# Patient Record
Sex: Male | Born: 1950 | Race: Black or African American | Hispanic: No | Smoking: Current every day smoker
Health system: Southern US, Community
[De-identification: ages and names within clinical notes are randomized; demographics above are authoritative.]

## PROBLEM LIST (undated history)

## (undated) DIAGNOSIS — C799 Secondary malignant neoplasm of unspecified site: Secondary | ICD-10-CM

## (undated) DIAGNOSIS — T148XXA Other injury of unspecified body region, initial encounter: Secondary | ICD-10-CM

## (undated) DIAGNOSIS — I10 Essential (primary) hypertension: Secondary | ICD-10-CM

## (undated) DIAGNOSIS — N400 Enlarged prostate without lower urinary tract symptoms: Secondary | ICD-10-CM

## (undated) HISTORY — PX: HAND SURGERY: SHX662

## (undated) HISTORY — DX: Other injury of unspecified body region, initial encounter: T14.8XXA

## (undated) HISTORY — DX: Secondary malignant neoplasm of unspecified site: C79.9

## (undated) HISTORY — DX: Essential (primary) hypertension: I10

## (undated) HISTORY — DX: Benign prostatic hyperplasia without lower urinary tract symptoms: N40.0

## (undated) HISTORY — PX: APPENDECTOMY: SHX54

---

## 1973-11-13 HISTORY — PX: EYE SURGERY: SHX253

## 2005-08-08 ENCOUNTER — Inpatient Hospital Stay (HOSPITAL_COMMUNITY): Admission: EM | Admit: 2005-08-08 | Discharge: 2005-08-10 | Payer: Self-pay | Admitting: Emergency Medicine

## 2005-08-08 ENCOUNTER — Encounter: Payer: Self-pay | Admitting: Emergency Medicine

## 2006-03-29 ENCOUNTER — Ambulatory Visit (HOSPITAL_COMMUNITY): Admission: RE | Admit: 2006-03-29 | Discharge: 2006-03-29 | Payer: Self-pay | Admitting: Family Medicine

## 2010-05-31 ENCOUNTER — Ambulatory Visit: Payer: Self-pay | Admitting: Gastroenterology

## 2010-05-31 DIAGNOSIS — R634 Abnormal weight loss: Secondary | ICD-10-CM

## 2010-05-31 DIAGNOSIS — K625 Hemorrhage of anus and rectum: Secondary | ICD-10-CM

## 2010-05-31 DIAGNOSIS — R630 Anorexia: Secondary | ICD-10-CM

## 2010-06-07 ENCOUNTER — Encounter: Payer: Self-pay | Admitting: Gastroenterology

## 2010-06-10 ENCOUNTER — Ambulatory Visit (HOSPITAL_COMMUNITY): Admission: RE | Admit: 2010-06-10 | Discharge: 2010-06-10 | Payer: Self-pay | Admitting: Gastroenterology

## 2010-06-10 ENCOUNTER — Ambulatory Visit: Payer: Self-pay | Admitting: Gastroenterology

## 2010-06-17 ENCOUNTER — Encounter (INDEPENDENT_AMBULATORY_CARE_PROVIDER_SITE_OTHER): Payer: Self-pay

## 2010-09-15 ENCOUNTER — Encounter (INDEPENDENT_AMBULATORY_CARE_PROVIDER_SITE_OTHER): Payer: Self-pay | Admitting: *Deleted

## 2010-12-13 NOTE — Letter (Signed)
Summary: Internal Other /TCS/? EGD order  Internal Other /TCS/? EGD order   Imported By: Cloria Spring LPN 11/91/4782 95:62:13  _____________________________________________________________________  External Attachment:    Type:   Image     Comment:   External Document

## 2010-12-13 NOTE — Letter (Signed)
Summary: Plan of Care, Need to Discuss  [facility]  [address]   [place], [place] [address]   Phone: [phone]  Fax: [phone]    June 17, 2010  Gregory Hickman [address] [place], [place]  [address] 05/22/1951   Dear Gregory Hickman,   We are writing this letter to inform you of treatment plans and/or discuss your plan of care.  We have tried several times to contact you; however, we have yet to reach you.  We ask that you please contact our office for follow-up on your gastrointestinal issues.  We can  be reached at [phone] to schedule an appointment, or to speak with someone regarding your health care needs.  Please do not neglect your health.   Sincerely,    Hendricks Limes LPN  [facility] Ph: [phone]    Fax: [phone]

## 2010-12-13 NOTE — Letter (Signed)
Summary: Recall Office Visit  [facility]  [address]   [place], [place] [address]   Phone: [phone]  Fax: [phone]      September 15, 2010   PACEY ALTIZER [address] [place], [place]  [address] [birth year]   Dear Mr. BAUMLER,   According to our records, it is time for you to schedule a follow-up office visit with [place].   At your convenience, please call [phone] to schedule an office visit. If you have any questions, concerns, or feel that this letter is in error, we would appreciate your call.   Sincerely,    Diana Eves  [facility] Ph: [phone]   Fax: [phone]

## 2010-12-13 NOTE — Letter (Signed)
Summary: release of information to [facility]  release of information to [facility]   Imported By: Rosine Beat 06/07/2010 13:53:27  _____________________________________________________________________  External Attachment:    Type:   Image     Comment:   External Document

## 2010-12-13 NOTE — Assessment & Plan Note (Signed)
Summary: RECTAL BLEEDING, ANOREXIA, wt loss   Visit Type:  Initial Consult Referring Provider:  Claggett Primary Care Provider:  Claggett, M. D.  Chief Complaint:  blood in stool/ wt loss.  History of Present Illness: Has hemorrhoids but having rectal bleeding x1 associated with swollen hemorrhoids. Unintentional 7 lb weight loss. Ran out of BP pills and no appetite. But got them today. Feels sick at stomach and vomits 1-2x/mo. Watery stools: most of the time, BM: 2x/day. has a dry cough. Cigs: 1pk q2days. No pain in abd pain in belly or problems swallowing.  Current Medications (verified): 1)  Lisinopril-Hydrochlorothiazide 20-12.5 Mg Tabs (Lisinopril-Hydrochlorothiazide) .... Take 1 Tablet By Mouth Two Times A Day  Allergies (verified): No Known Drug Allergies  Past History:  Past Medical History: Hypertension Back pain  Past Surgical History: BIL hands Eyes Appendectomy  Family History: No FH of Colon Cancer or polyps  Social History: Divorced, no kids Used to work on the farm and [employer] Alcohol Use - yes: 2 cans a day  Review of Systems       Per HPI otherwise allsystems negative.   Vital Signs:  Patient profile:   60 year old male Height:      63 inches Weight:      124.50 pounds BMI:     22.13 Temp:     98.1 degrees F oral Pulse rate:   80 / minute BP sitting:   120 / 80  (left arm) Cuff size:   regular  Vitals Entered By: Cloria Spring LPN (May 31, 2010 3:41 PM)  Physical Exam  General:  Well developed, well nourished, no acute distress. Head:  Normocephalic and atraumatic. Eyes:  PERRLA. Mouth:  No deformity or lesions. Neck:  Supple; no masses. Lungs:  Clear throughout to auscultation. Heart:  Regular rate and rhythm; no murmurs. Abdomen:  Soft, nontender and nondistended. No masses, or hernias noted. Normal bowel sounds. Extremities:  No edema noted. Neurologic:  Alert and  oriented x4;  grossly normal neurologically.  Impression &  Recommendations:  Problem # 1:  RECTAL BLEEDING (ICD-569.3) Assessment New Lkely 2o to hemorrhoids. Differential includes colorectal polyp or CA. TCS/?EGD on 7/29-TRILYTE. Explained procedure. Obtain recent labs from [facility].  Problem # 2:  ANOREXIA (ICD-783.0) Assessment: New May be due to colon CA or H. pylori gastritis, doubt gastric CA. EGD  7/29  Problem # 3:  WEIGHT LOSS, RECENT (VWU-981.19) Assessment: New unexplained. TCS/?EGD next week.  CC: PCP  Appended Document: RECTAL BLEEDING, ANOREXIA, wt loss weight 131 lbs earlier this year.  Appended Document: Orders Update    Clinical Lists Changes  Orders: Added new Service order of Consultation Level III [phone]) - Signed      Appended Document: RECTAL BLEEDING, ANOREXIA, wt loss July 2011 wbc 7.9 HB 15 PLT 197 CR 0.98 ALB 4.9 ALK PHOS 72  AST 107 ALT 68 TRIG 40 CHOL 171  Appended Document: RECTAL BLEEDING, ANOREXIA, wt loss Elevated liver enzymes 2o to EtOH Hepatitis. Needs HCV Ab. OPV in 3-4 mos.  Appended Document: RECTAL BLEEDING, ANOREXIA, wt loss OCT 2010  128 LBS BMI 22.67 AST 36 ALT 29  CHOL 159  TRIG 37 CR 0.99  Appended Document: RECTAL BLEEDING, ANOREXIA, wt loss reminder in computer

## 2011-03-31 NOTE — Op Note (Signed)
NAMEOSHA, ERRICO            ACCOUNT NO.:  [id number]   MEDICAL RECORD NO.:  [id number]          PATIENT TYPE:  INP   LOCATION:  5017                         FACILITY:  [facility]   PHYSICIAN:  Dionne Ano. Gramig III, M.D.DATE OF BIRTH:  [birth year]   DATE OF PROCEDURE:  08/08/2005  DATE OF DISCHARGE:                                 OPERATIVE REPORT   PREOPERATIVE DIAGNOSIS:  Crush injury, right hand with open fourth and fifth  metacarpal fractures and large flap avulsion volarly about the hand.   POSTOPERATIVE DIAGNOSIS:  Crush injury, right hand with open fourth and  fifth metacarpal fractures and large flap avulsion volarly about the hand.   OPERATION PERFORMED:  1.  Incision and drainage of open right hand fracture.  This was an      excisional debridement of open fourth and fifth metacarpal fractures.  2.  Exploration, right hand with neurolysis of the ulnar digital nerve to      the small finger and complex volar flap closure.  3.  Open reduction internal fixation, right fourth metacarpal fracture.  4.  Open reduction internal fixation, right fifth metacarpal fracture.  5.  Stress radiography.   SURGEON:  Dionne Ano. Amanda Pea, M.D.   ASSISTANT:  Karie Chimera, P.A.-C.   ANESTHESIA:  General.   COMPLICATIONS:  None.   DRAINS:  Multiple.   INDICATIONS FOR PROCEDURE:  This patient is an unfortunate 60 year old male  who was injured today at approximately 1:30.  He was seen at [facility], transferred to [facility] system for evaluation and  treatment.  I was asked to see him and take over his care.  I discussed with  him his upper extremity predicament after the crush injury.  He has open  fourth and fifth metacarpal fractures and large degloving injury to the  volar pad of his hand.  The patient and I have discussed risks of surgery  including infection, digital loss, poor function, need for amputation, etc.  With this in mind, he desires to proceed.   DESCRIPTION OF PROCEDURE:  The patient was seen by myself and anesthesia and  taken to the operative suite, underwent smooth induction of general  anesthesia.  He was laid supine, appropriately padded, prepped and draped in  the usual sterile fashion about the right upper extremity after body parts  were secured and he was well padded.  I should note that he was given  tetanus and Ancef in the emergency room.  He will be placed on gentamicin  and Ancef postoperatively.  Once the patient had sterile field secured, I  performed incision and drainage of skin and subcutaneous tissue I removed a  1 mm rim of tissue and any nonviable tissue including grass and dirty debris  in the volar flap laceration.  The patient had exposure of the small finger  ulnar digital nerve.  This underwent a neurolysis without difficulty.  Following this, I then carefully and meticulously removed all foreign  contaminated portions of the wound and in addition to this, performed  removal of devitalized hypothenar muscle.  The patient did have intact  neurovascular bundles.  The flexor tendon apparatus was fortunately intact  as well.  Once this was done, the patient then underwent greater than 6 L of  irrigation, placed in the hand.  This was irrigated copiously without  difficulty.  Thus incision and drainage of open fractures, exploration and  neurolysis of the ulnar digital nerve was accomplished.  This was a very  large volar laceration secondary to the crushing injury.  Once this done, I  then made an incision under tourniquet control about the base of the fourth  and fifth metacarpals dorsally.  I dissected down, made pilot holes about  the base of the fourth and fifth metacarpals and threaded a 0.062 K-wire  down the shaft, intramedullary of the fourth metacarpal and engaged it  distally about the metacarpal head.  A 0.045 and two 0.035 K-wires were  stacked in the small finger.  These achieved reduction of the  fracture  fragments which were very unstable.  The patient had the open reduction  internal fixation of the fourth and fifth metacarpal fractures performed in  this fashion without difficulty.  Stress radiography revealed excellent  position at the conclusion of the case and I was pleased with this.  Following this, I then performed a small incision between the third and  fourth metacarpals, decompressed the large hematoma and placed a drain here.  Once this was done, tourniquet was deflated.  I performed copious irrigation  to the area followed by complex closure of the flap laceration with multiple  blue vessel loop drains used.  Combination of 4-0 chromic and 4-0 Prolene  was used for the complex closure and I did leave portions open to allow for  the egress of fluid and prevention of hematoma formation.  The patient had  excellent refill, good splay of the fingers, x-rays looked excellent and I  was pleased with the same, placement of sterile dressing.  Compartments were  soft and were basically decompressed from the injury itself.  Following  this, he was placed in a volar plaster splint and extubated and taken to  recovery room.  All sponge and needle counts were reported as correct.  During the course of his operation, he had greater than 8 L of fluid placed  through his wounds and this will hopefully prevent infection.  This is a  very severe injury and one that we will need to watch closely.           ______________________________  Dionne Ano. Everlene Other, M.D.     Nash Mantis  D:  08/08/2005  T:  08/09/2005  Job:  [id number]

## 2013-11-13 HISTORY — PX: HERNIA REPAIR: SHX51

## 2017-12-28 ENCOUNTER — Encounter: Payer: Self-pay | Admitting: Gastroenterology

## 2018-03-07 ENCOUNTER — Ambulatory Visit: Payer: Self-pay | Admitting: Nurse Practitioner

## 2018-03-13 ENCOUNTER — Encounter: Payer: Self-pay | Admitting: Nurse Practitioner

## 2018-03-25 ENCOUNTER — Ambulatory Visit: Payer: Self-pay | Admitting: Nurse Practitioner

## 2018-04-09 ENCOUNTER — Encounter: Payer: Self-pay | Admitting: Nurse Practitioner

## 2018-04-09 ENCOUNTER — Ambulatory Visit (INDEPENDENT_AMBULATORY_CARE_PROVIDER_SITE_OTHER): Payer: Medicare HMO | Admitting: Nurse Practitioner

## 2018-04-09 DIAGNOSIS — R195 Other fecal abnormalities: Secondary | ICD-10-CM | POA: Insufficient documentation

## 2018-04-09 DIAGNOSIS — D649 Anemia, unspecified: Secondary | ICD-10-CM

## 2018-04-09 NOTE — Progress Notes (Signed)
Primary Care Physician:  Joyice Faster, FNP Primary Gastroenterologist:  Dr. Oneida Alar  Chief Complaint  Patient presents with  . Consult    TCS. Last done in about 2011    HPI:   Gregory Hickman is a 67 y.o. male who presents on referral from primary care for colonoscopy, dark stools, EtOH.  Reviewed information provided with the referral including his visit dated 2020-01-1418.  At that time primary care noted complaints of bowel movements looking black for 1 year.  Has a history of GERD, on omeprazole 20 mg daily.  Drinks 3-4 beers a day, depending on if he has the money for it.  No interest in quitting drinking at this time.  Occasionally uses marijuana "and cocaine if he can afford it."  Per patient description, sometimes stools are normal and sometimes dark.  Denies hematochezia.  Noted history of anemia and primary care ordered labs to follow-up at that time.  No labs were included with the referral.  Last TCS 06/10/2010 which found tortuous colon, otherwise no polyps masses inflammatory changes or AVMs seen.  Normal terminal ileum.  Rare descending colon and sigmoid diverticula.  Moderate internal hemorrhoids.  Pathology of random colon biopsy found benign colonic mucosa.  Recommended repeat screening colonoscopy in 10 years (2021).  EGD completed the same day found normal esophagus without evidence of Barrett's, mass, erosion, ulceration, or stricture.  Diffuse erythema and edema of the gastric mucosa with frequent erosions status post biopsies.  Diffuse erythema and patchy edema of the duodenal bulb and second portion of the duodenum.  Normal ampulla.  Consistent with moderate gastritis and duodenitis likely causing nausea, vomiting, possible anorexia.  Stomach biopsies on chronic, active gastritis with H. pylori.  Recommended avoid NSAIDs, aspirin.  High-fiber diet.  Omeprazole 20 mg daily.  I am unable to determine if he was ever treated for H. pylori.  No recent labs in our system  since 2011.  Today he states he's doing ok overall. Denies abdominal pain, N/V, hematochezia. Has intermittent black stools which he thinks is related to medication (thinks it is his iron). Denies recent bloodwork with PCP. Denies fever, chills, unintentional weight loss, acute changes in his bowel movement. Denies chest pain, dyspnea, dizziness, lightheadedness, syncope, near syncope. Denies any other upper or lower GI symptoms.  Past Medical History:  Diagnosis Date  . BPH (benign prostatic hyperplasia)   . Fracture   . Hypertension     Past Surgical History:  Procedure Laterality Date  . APPENDECTOMY    . EYE SURGERY  1975  . HAND SURGERY    . HERNIA REPAIR  2015    Current Outpatient Medications  Medication Sig Dispense Refill  . amLODipine (NORVASC) 10 MG tablet Take 1 tablet by mouth daily.    Marland Kitchen atorvastatin (LIPITOR) 40 MG tablet Take 1 tablet by mouth daily.    . clopidogrel (PLAVIX) 75 MG tablet Take 1 tablet by mouth daily.    Marland Kitchen dutasteride (AVODART) 0.5 MG capsule Take 1 capsule by mouth daily.    . ferrous sulfate 325 (65 FE) MG tablet Take 325 mg by mouth 2 (two) times daily with a meal.    . lisinopril-hydrochlorothiazide (PRINZIDE,ZESTORETIC) 20-12.5 MG tablet Take 1 tablet by mouth daily.    . nabumetone (RELAFEN) 750 MG tablet Take 1 tablet by mouth 2 (two) times daily.    Marland Kitchen omeprazole (PRILOSEC) 20 MG capsule Take 1 capsule by mouth daily.    . tamsulosin (FLOMAX) 0.4 MG  CAPS capsule Take 1 tablet by mouth daily.     No current facility-administered medications for this visit.     Allergies as of 04/09/2018  . (No Known Allergies)    Family History  Problem Relation Age of Onset  . Colon cancer Neg Hx     Social History   Socioeconomic History  . Marital status: Divorced    Spouse name: Not on file  . Number of children: Not on file  . Years of education: Not on file  . Highest education level: Not on file  Occupational History  . Not on file    Social Needs  . Financial resource strain: Not on file  . Food insecurity:    Worry: Not on file    Inability: Not on file  . Transportation needs:    Medical: Not on file    Non-medical: Not on file  Tobacco Use  . Smoking status: Current Every Day Smoker    Packs/day: 0.50    Types: Cigarettes  . Smokeless tobacco: Never Used  Substance and Sexual Activity  . Alcohol use: Yes    Comment: As of 04/09/18: 3-4 beers daily  . Drug use: Yes    Types: Marijuana, "Crack" cocaine    Comment: 1-2 times per month; marijuana, cocaine when he can afford it  . Sexual activity: Not on file  Lifestyle  . Physical activity:    Days per week: Not on file    Minutes per session: Not on file  . Stress: Not on file  Relationships  . Social connections:    Talks on phone: Not on file    Gets together: Not on file    Attends religious service: Not on file    Active member of club or organization: Not on file    Attends meetings of clubs or organizations: Not on file    Relationship status: Not on file  . Intimate partner violence:    Fear of current or ex partner: Not on file    Emotionally abused: Not on file    Physically abused: Not on file    Forced sexual activity: Not on file  Other Topics Concern  . Not on file  Social History Narrative  . Not on file    Review of Systems: Complete ROS negative except as per HPI.    Physical Exam: BP 106/60   Pulse 86   Temp (!) 97.2 F (36.2 C) (Oral)   Ht 5\' 3"  (1.6 m)   Wt 115 lb 9.6 oz (52.4 kg)   BMI 20.48 kg/m  General:   Alert and oriented. Pleasant and cooperative. Well-nourished and well-developed.  Head:  Normocephalic and atraumatic. Eyes:  Without icterus, sclera clear and conjunctiva pink.  Ears:  Normal auditory acuity. Cardiovascular:  S1, S2 present without murmurs appreciated. Extremities without clubbing or edema. Respiratory:  Clear to auscultation bilaterally. No wheezes, rales, or rhonchi. No distress.   Gastrointestinal:  +BS, soft, non-tender and non-distended. No HSM noted. No guarding or rebound. No masses appreciated.  Rectal:  Deferred  Musculoskalatal:  Symmetrical without gross deformities. Skin:  Intact without significant lesions or rashes. Neurologic:  Alert and oriented x4;  grossly normal neurologically. Psych:  Alert and cooperative. Normal mood and affect. Heme/Lymph/Immune: No excessive bruising noted.    04/09/2018 12:20 PM   Disclaimer: This note was dictated with voice recognition software. Similar sounding words can inadvertently be transcribed and may not be corrected upon review.

## 2018-04-09 NOTE — Progress Notes (Signed)
cc'd to pcp 

## 2018-04-09 NOTE — Patient Instructions (Signed)
1. Have your lab tests completed when you are able to. 2. Stop taking iron for 1 week. 3. Next Wednesday or Thursday, use the stool test for blood at home. 4. You can bring it back to our office when you have completed it. 5. We will call you with the results. 6. Follow-up in 2 months. 7. Depending on the results of your tests above, we will may need to plan for colonoscopy and/or an upper endoscopy. 8. Call if you have any questions or concerns.  At [facility] we value your feedback. You may receive a survey about your visit today. Please share your experience as we strive to create trusting relationships with our patients to provide genuine, compassionate, quality care.  It was great to see you today!  I hope you have a great summer!!

## 2018-04-09 NOTE — Assessment & Plan Note (Signed)
The patient notes intermittent dark stools when he is taking iron.  When he is not taking iron his stools are normal.  At this point I will have him stop iron for 1 week and check an iFOBT.  Further labs as per below.  Pending results, may need colonoscopy and/or upper endoscopy.  Follow-up in 2 months.

## 2018-04-09 NOTE — Assessment & Plan Note (Signed)
Per primary care notes, patient has a history of anemia.  No recent labs.  At this point I will check a CBC, iron, ferritin.  I will have him stop taking his iron supplementation for 1 week and then perform an iFOBT test to see if there is any blood in his stools.  We will call him with results.  Pending his results we may need to schedule colonoscopy and/or EGD.  Follow-up in 2 months.

## 2018-04-10 LAB — CBC WITH DIFFERENTIAL/PLATELET
BASOS ABS: 73 {cells}/uL (ref 0–200)
Basophils Relative: 0.6 %
EOS PCT: 0.6 %
Eosinophils Absolute: 73 cells/uL (ref 15–500)
HCT: 42.3 % (ref 38.5–50.0)
HEMOGLOBIN: 14.8 g/dL (ref 13.2–17.1)
Lymphs Abs: 3582 cells/uL (ref 850–3900)
MCH: 31.6 pg (ref 27.0–33.0)
MCHC: 35 g/dL (ref 32.0–36.0)
MCV: 90.2 fL (ref 80.0–100.0)
MONOS PCT: 11 %
MPV: 10.5 fL (ref 7.5–12.5)
NEUTROS ABS: 7042 {cells}/uL (ref 1500–7800)
Neutrophils Relative %: 58.2 %
PLATELETS: 364 10*3/uL (ref 140–400)
RBC: 4.69 10*6/uL (ref 4.20–5.80)
RDW: 13.2 % (ref 11.0–15.0)
Total Lymphocyte: 29.6 %
WBC mixed population: 1331 cells/uL — ABNORMAL HIGH (ref 200–950)
WBC: 12.1 10*3/uL — ABNORMAL HIGH (ref 3.8–10.8)

## 2018-04-10 LAB — IRON: IRON: 72 ug/dL (ref 50–180)

## 2018-04-10 LAB — FERRITIN: FERRITIN: 63 ng/mL (ref 20–380)

## 2018-04-16 NOTE — Progress Notes (Signed)
Pt is aware of results and that he needs to follow up with PCP on his elevated white blood count. Forwarding to Manuela Schwartz to forward to PCP.

## 2018-04-16 NOTE — Progress Notes (Signed)
To Manuela Schwartz.

## 2018-04-30 ENCOUNTER — Telehealth: Payer: Self-pay

## 2018-04-30 NOTE — Telephone Encounter (Signed)
Received a fax from [facility] asking if pt has done iFOBT yet. I called pt and he said he has not had transportation. He will try to do in the next few days and will mail it to [place]. I faxed a note to [facility] and told them and that I would let them know when it is done.

## 2018-06-27 ENCOUNTER — Other Ambulatory Visit (HOSPITAL_COMMUNITY): Payer: Self-pay | Admitting: Family

## 2018-06-27 DIAGNOSIS — R9389 Abnormal findings on diagnostic imaging of other specified body structures: Secondary | ICD-10-CM

## 2018-06-27 DIAGNOSIS — R079 Chest pain, unspecified: Secondary | ICD-10-CM

## 2018-07-09 ENCOUNTER — Encounter: Payer: Self-pay | Admitting: Nurse Practitioner

## 2018-07-09 ENCOUNTER — Ambulatory Visit (HOSPITAL_COMMUNITY): Payer: Medicare HMO

## 2018-07-09 ENCOUNTER — Ambulatory Visit (INDEPENDENT_AMBULATORY_CARE_PROVIDER_SITE_OTHER): Payer: Medicare HMO | Admitting: Nurse Practitioner

## 2018-07-09 VITALS — BP 130/76 | HR 62 | Temp 96.6°F | Ht 63.0 in | Wt 108.0 lb

## 2018-07-09 DIAGNOSIS — R634 Abnormal weight loss: Secondary | ICD-10-CM

## 2018-07-09 DIAGNOSIS — D649 Anemia, unspecified: Secondary | ICD-10-CM

## 2018-07-09 DIAGNOSIS — R195 Other fecal abnormalities: Secondary | ICD-10-CM | POA: Diagnosis not present

## 2018-07-09 NOTE — Assessment & Plan Note (Signed)
History of anemia, last CBC with normal hemoglobin, iron, ferritin.  I will recheck his CBC today as it has not been checked in about 3 months.  We will call him with results.  Follow-up in 3 months.

## 2018-07-09 NOTE — Assessment & Plan Note (Signed)
The patient has had an objective weight loss of about 7 or 8 pounds in the past 3 months.  Looking at his chart it appears he has intermittent weight loss as far back as 2011 that I can find.  His colonoscopy and endoscopy are up-to-date.  He has generally unhealthy lifestyle including 1/4 pack of cigarettes a day, 3-4 beers a day, marijuana 1-2 times a week, cocaine intermittently as he can afford.  This likely has a plan with his weight loss.  We will see him back in 3 months to recheck his weight and for any further symptoms that may justify early interval endoscopic evaluation.

## 2018-07-09 NOTE — Assessment & Plan Note (Signed)
He states when he stopped taking iron her stools returned to normal.  However, he declined FOBT.  He is taking iron again and his stools are black again.  Given his recent lab work in May I doubt he is having any GI bleeding.  We will continue to follow.  Follow-up in 3 months.

## 2018-07-09 NOTE — Addendum Note (Signed)
Addended by: Gordy Levan, Aralyn Nowak A on: 07/09/2018 08:47 AM   Modules accepted: Orders

## 2018-07-09 NOTE — Progress Notes (Signed)
CC'D TO PCP °

## 2018-07-09 NOTE — Patient Instructions (Signed)
1. Have your blood test done when you are able to. 2. Continue your current medications. 3. Return for follow-up in 3 months. 4. Call us if you have any questions or concerns.  At [facility] we value your feedback. You may receive a survey about your visit today. Please share your experience as we strive to create trusting relationships with our patients to provide genuine, compassionate, quality care.  We appreciate your understanding and patience as we review any laboratory studies, imaging, and other diagnostic tests that are ordered as we care for you. Our office policy is 5 business days for review of these results, and any emergent or urgent results are addressed in a timely manner for your best interest. If you do not hear from our office in 1 week, please contact us.   We also encourage the use of MyChart, which contains your medical information for your review as well. If you are not enrolled in this feature, an access code is available on this after visit summary for your convenience. Thank you for allowing [place] to be involved in the care of you and your family.   It was great to see you today!  I hope you have a great summer!!

## 2018-07-09 NOTE — Progress Notes (Signed)
Referring Provider: Joyice Faster, FNP Primary Care Physician:  Joyice Faster, FNP Primary GI:  Dr. Oneida Alar  Chief Complaint  Patient presents with  . Anemia    dark stools    HPI:   Gregory Hickman is a 67 y.o. male who presents to follow-up on anemia.  The patient was last seen in our office 04/09/2018 for the same as well as dark stools.  Noted history of GERD and daily alcohol consumption of 3-4 beers a day depending on finances.  No interest in quitting drinking.  Occasional use of marijuana and "cocaine if I can afford it."  Stools intermittently dark.  Last colonoscopy 2011 which was essentially normal other than tortuous colon and recommended repeat in 2021.  EGD the same day with diffuse erythema and edema of the gastric mucosa and frequent erosions status post biopsy, diffuse erythema and patchy edema of the duodenal bulb and second portion of the duodenum.  Essentially consistent with moderate gastritis and duodenitis with biopsy showing H. pylori.  Recommended avoid NSAIDs.  Unknown treatment status of H. Pylori.  At his last visit he noted intermittent black stools that he attributed to medication and described recent blood work with primary care.  No GI symptoms.  Recommended he bring a copy of primary care blood work, stop iron for 1 week, he Hemoccult test at home, follow-up in 2 months.  Query need for early interval colonoscopy or endoscopy depending on results.  Also recommended CBC, iron, ferritin.  Completed 04/01/2018 which showed normal hemoglobin, mild leukocytosis which was forwarded to primary care.  Normal platelets.  Iron and ferritin were both normal as well.  It does not appear he completed iFOBT  Today he states he's doing well. Still with dark stools but on iron. Denies abdominal pain, N/V, hematochezia, fever, chills. Describes unintentional weight loss of 20 pounds over 3 months. Objectively has lost 8 lbs in 3 months. States he never completed the  iFOBT. His big concern is his back pain, has a CT ordered today. Denies chest pain, dyspnea, dizziness, lightheadedness, syncope, near syncope. Denies any other upper or lower GI symptoms.  Declines iFOBT.  Past Medical History:  Diagnosis Date  . BPH (benign prostatic hyperplasia)   . Fracture   . Hypertension     Past Surgical History:  Procedure Laterality Date  . APPENDECTOMY    . EYE SURGERY  1975  . HAND SURGERY    . HERNIA REPAIR  2015    Current Outpatient Medications  Medication Sig Dispense Refill  . amLODipine (NORVASC) 10 MG tablet Take 1 tablet by mouth daily.    Marland Kitchen atorvastatin (LIPITOR) 40 MG tablet Take 1 tablet by mouth daily.    . clopidogrel (PLAVIX) 75 MG tablet Take 1 tablet by mouth daily.    Marland Kitchen dutasteride (AVODART) 0.5 MG capsule Take 1 capsule by mouth daily.    . ferrous sulfate 325 (65 FE) MG tablet Take 325 mg by mouth 2 (two) times daily with a meal.    . lisinopril-hydrochlorothiazide (PRINZIDE,ZESTORETIC) 20-12.5 MG tablet Take 1 tablet by mouth daily.    . nabumetone (RELAFEN) 750 MG tablet Take 1 tablet by mouth 2 (two) times daily.    Marland Kitchen omeprazole (PRILOSEC) 20 MG capsule Take 1 capsule by mouth daily.    . tamsulosin (FLOMAX) 0.4 MG CAPS capsule Take 1 tablet by mouth daily.     No current facility-administered medications for this visit.     Allergies as  of 07/09/2018  . (No Known Allergies)    Family History  Problem Relation Age of Onset  . Colon cancer Neg Hx     Social History   Socioeconomic History  . Marital status: Divorced    Spouse name: Not on file  . Number of children: Not on file  . Years of education: Not on file  . Highest education level: Not on file  Occupational History  . Not on file  Social Needs  . Financial resource strain: Not on file  . Food insecurity:    Worry: Not on file    Inability: Not on file  . Transportation needs:    Medical: Not on file    Non-medical: Not on file  Tobacco Use  .  Smoking status: Current Every Day Smoker    Packs/day: 0.25    Types: Cigarettes  . Smokeless tobacco: Never Used  . Tobacco comment: one pack of cigs last 3 days  Substance and Sexual Activity  . Alcohol use: Yes    Comment: As of 07/09/18: 3-4 beers daily  . Drug use: Yes    Types: Marijuana, "Crack" cocaine    Comment: 1-2 times per month; marijuana, cocaine when he can afford it  . Sexual activity: Not on file  Lifestyle  . Physical activity:    Days per week: Not on file    Minutes per session: Not on file  . Stress: Not on file  Relationships  . Social connections:    Talks on phone: Not on file    Gets together: Not on file    Attends religious service: Not on file    Active member of club or organization: Not on file    Attends meetings of clubs or organizations: Not on file    Relationship status: Not on file  Other Topics Concern  . Not on file  Social History Narrative  . Not on file    Review of Systems: Complete ROS negative except as per HPI.   Physical Exam: BP 130/76   Pulse 62   Temp (!) 96.6 F (35.9 C) (Oral)   Ht 5\' 3"  (1.6 m)   Wt 108 lb (49 kg)   BMI 19.13 kg/m  General:   Alert and oriented. Pleasant and cooperative. Well-nourished and well-developed.  Eyes:  Without icterus, sclera clear and conjunctiva pink.  Ears:  Normal auditory acuity. Cardiovascular:  S1, S2 present without murmurs appreciated. Extremities without clubbing or edema. Respiratory:  Clear to auscultation bilaterally. No wheezes, rales, or rhonchi. No distress.  Gastrointestinal:  +BS, soft, non-tender and non-distended. No HSM noted. No guarding or rebound. No masses appreciated.  Rectal:  Deferred  Musculoskalatal:  Symmetrical without gross deformities. Skin:  Intact without significant lesions or rashes. Neurologic:  Alert and oriented x4;  grossly normal neurologically. Psych:  Alert and cooperative. Normal mood and affect. Heme/Lymph/Immune: No excessive bruising  noted.    07/09/2018 8:33 AM   Disclaimer: This note was dictated with voice recognition software. Similar sounding words can inadvertently be transcribed and may not be corrected upon review.

## 2018-07-24 ENCOUNTER — Ambulatory Visit (HOSPITAL_COMMUNITY)
Admission: RE | Admit: 2018-07-24 | Discharge: 2018-07-24 | Disposition: A | Discharge status: Home / Self Care | Payer: Medicare HMO | Source: Ambulatory Visit | Attending: Family | Admitting: Family

## 2018-07-24 DIAGNOSIS — J439 Emphysema, unspecified: Secondary | ICD-10-CM | POA: Diagnosis not present

## 2018-07-24 DIAGNOSIS — R079 Chest pain, unspecified: Secondary | ICD-10-CM | POA: Diagnosis present

## 2018-07-24 DIAGNOSIS — R590 Localized enlarged lymph nodes: Secondary | ICD-10-CM | POA: Diagnosis not present

## 2018-07-24 DIAGNOSIS — I700 Atherosclerosis of aorta: Secondary | ICD-10-CM | POA: Diagnosis not present

## 2018-07-24 DIAGNOSIS — R9389 Abnormal findings on diagnostic imaging of other specified body structures: Secondary | ICD-10-CM | POA: Diagnosis present

## 2018-07-24 DIAGNOSIS — R918 Other nonspecific abnormal finding of lung field: Secondary | ICD-10-CM | POA: Insufficient documentation

## 2018-07-24 LAB — POCT I-STAT CREATININE: Creatinine, Ser: 1 mg/dL (ref 0.61–1.24)

## 2018-07-24 MED ORDER — IOPAMIDOL (ISOVUE-300) INJECTION 61%
75.0000 mL | Freq: Once | INTRAVENOUS | Status: AC | PRN
  Administered 2018-07-24: 75 mL via INTRAVENOUS

## 2018-07-30 ENCOUNTER — Other Ambulatory Visit: Payer: Self-pay

## 2018-07-30 ENCOUNTER — Encounter (HOSPITAL_COMMUNITY): Payer: Self-pay | Admitting: Internal Medicine

## 2018-07-30 ENCOUNTER — Inpatient Hospital Stay (HOSPITAL_COMMUNITY): Payer: Medicare HMO | Attending: Internal Medicine | Admitting: Internal Medicine

## 2018-07-30 ENCOUNTER — Inpatient Hospital Stay (HOSPITAL_COMMUNITY): Payer: Medicare HMO

## 2018-07-30 VITALS — BP 128/59 | HR 68 | Temp 98.4°F | Resp 16 | Ht 63.0 in | Wt 107.7 lb

## 2018-07-30 DIAGNOSIS — R634 Abnormal weight loss: Secondary | ICD-10-CM | POA: Insufficient documentation

## 2018-07-30 DIAGNOSIS — R9389 Abnormal findings on diagnostic imaging of other specified body structures: Secondary | ICD-10-CM

## 2018-07-30 DIAGNOSIS — I10 Essential (primary) hypertension: Secondary | ICD-10-CM | POA: Diagnosis not present

## 2018-07-30 DIAGNOSIS — G4489 Other headache syndrome: Secondary | ICD-10-CM | POA: Diagnosis not present

## 2018-07-30 DIAGNOSIS — F1290 Cannabis use, unspecified, uncomplicated: Secondary | ICD-10-CM | POA: Insufficient documentation

## 2018-07-30 DIAGNOSIS — M545 Low back pain: Secondary | ICD-10-CM | POA: Diagnosis not present

## 2018-07-30 DIAGNOSIS — Z720 Tobacco use: Secondary | ICD-10-CM | POA: Insufficient documentation

## 2018-07-30 DIAGNOSIS — R42 Dizziness and giddiness: Secondary | ICD-10-CM | POA: Insufficient documentation

## 2018-07-30 DIAGNOSIS — Z7289 Other problems related to lifestyle: Secondary | ICD-10-CM | POA: Insufficient documentation

## 2018-07-30 DIAGNOSIS — R911 Solitary pulmonary nodule: Secondary | ICD-10-CM | POA: Diagnosis not present

## 2018-07-30 LAB — CBC WITH DIFFERENTIAL/PLATELET
BASOS PCT: 0 %
Basophils Absolute: 0 10*3/uL (ref 0.0–0.1)
EOS ABS: 0 10*3/uL (ref 0.0–0.7)
EOS PCT: 0 %
HCT: 38.3 % — ABNORMAL LOW (ref 39.0–52.0)
HEMOGLOBIN: 13.1 g/dL (ref 13.0–17.0)
LYMPHS PCT: 23 %
Lymphs Abs: 2.6 10*3/uL (ref 0.7–4.0)
MCH: 31.8 pg (ref 26.0–34.0)
MCHC: 34.2 g/dL (ref 30.0–36.0)
MCV: 93 fL (ref 78.0–100.0)
MONO ABS: 1.3 10*3/uL — AB (ref 0.1–1.0)
MONOS PCT: 12 %
NEUTROS ABS: 7.3 10*3/uL (ref 1.7–7.7)
Neutrophils Relative %: 65 %
Platelets: 333 10*3/uL (ref 150–400)
RBC: 4.12 MIL/uL — ABNORMAL LOW (ref 4.22–5.81)
RDW: 13.8 % (ref 11.5–15.5)
WBC: 11.3 10*3/uL — ABNORMAL HIGH (ref 4.0–10.5)

## 2018-07-30 LAB — COMPREHENSIVE METABOLIC PANEL
ALBUMIN: 3.6 g/dL (ref 3.5–5.0)
ALT: 10 U/L (ref 0–44)
ANION GAP: 11 (ref 5–15)
AST: 17 U/L (ref 15–41)
Alkaline Phosphatase: 101 U/L (ref 38–126)
BUN: 10 mg/dL (ref 8–23)
CALCIUM: 9.5 mg/dL (ref 8.9–10.3)
CO2: 26 mmol/L (ref 22–32)
Chloride: 94 mmol/L — ABNORMAL LOW (ref 98–111)
Creatinine, Ser: 0.73 mg/dL (ref 0.61–1.24)
GFR calc non Af Amer: >60 mL/min (ref >60)
Glucose, Bld: 78 mg/dL (ref 70–99)
POTASSIUM: 3.2 mmol/L — AB (ref 3.5–5.1)
SODIUM: 131 mmol/L — AB (ref 135–145)
TOTAL PROTEIN: 7.7 g/dL (ref 6.5–8.1)
Total Bilirubin: 0.6 mg/dL (ref 0.3–1.2)

## 2018-07-30 LAB — LACTATE DEHYDROGENASE: LDH: 107 U/L (ref 98–192)

## 2018-07-30 NOTE — Patient Instructions (Signed)
[place] [facility] Discharge Instructions   You were seen today by Dr. Zoila Shutter. She went over your history and your recent scan. She would like for you to have a lung biopsy to determine if you have lung cancer.  She would like for you to have a PET scan and also MRI of your brain.  We will see you back for follow up after you have had the above.    Thank you for choosing [place] at [facility] to provide your oncology and hematology care.  To afford each patient quality time with our provider, please arrive at least 15 minutes before your scheduled appointment time.    If you have a lab appointment with the [facility] please come in thru the  Main Entrance and check in at the main information desk  You need to re-schedule your appointment should you arrive 10 or more minutes late.  We strive to give you quality time with our providers, and arriving late affects you and other patients whose appointments are after yours.  Also, if you no show three or more times for appointments you may be dismissed from the clinic at the providers discretion.     Again, thank you for choosing [facility].  Our hope is that these requests will decrease the amount of time that you wait before being seen by our physicians.       _____________________________________________________________  Should you have questions after your visit to [facility], please contact our office at [phone] between the hours of 8:30 a.m. and 4:30 p.m.  Voicemails left after 4:30 p.m. will not be returned until the following business day.  For prescription refill requests, have your pharmacy contact our office.       Resources For Cancer Patients and their Caregivers ? American Cancer Society: Can assist with transportation, wigs, general needs, runs Look Good Feel Better.        (478)786-8908 ? Cancer Care: Provides financial  assistance, online support groups, medication/co-pay assistance.  1-800-813-HOPE 954-520-8792) ? [place] Assists [place] cancer patients and their families through emotional , educational and financial support.  (601)005-0318 ? [place] DSS Where to apply for food stamps, Medicaid and utility assistance. 786-743-7657 ? [facility]: Transportation to medical appointments. 754-462-3533 ? Social Security Administration: May apply for disability if have a Stage IV cancer. 575-188-4165 (947)072-7963 ? [employer], Disability and Transit Services: Assists with nutrition, care and transit needs. [place] Support Programs:   > Cancer Support Group  2nd Tuesday of the month 1pm-2pm, Journey Room   > Creative Journey  3rd Tuesday of the month 1130am-1pm, Journey Room

## 2018-07-30 NOTE — Progress Notes (Signed)
Referring Physician:  [facility]  Diagnosis Abnormal CAT scan - Plan: CBC with Differential/Platelet, Comprehensive metabolic panel, Lactate dehydrogenase, CT Biopsy, CANCELED: CT Biopsy  Other headache syndrome - Plan: MR Brain W Wo Contrast, CBC with Differential/Platelet, Comprehensive metabolic panel, Lactate dehydrogenase, CANCELED: CT Biopsy  Solitary pulmonary nodule - Plan: NM PET Image Initial (PI) Skull Base To Thigh  Staging Cancer Staging No matching staging information was found for the patient.  Assessment and Plan:  1.  Abnormal scan findings.  Patient was complaining of several month history of back pain.  He had a CT of the chest done 07/24/2018 that showed IMPRESSION: 1. Dominant spiculated left upper lobe mass with multiple enlarged mediastinal, hilar and left axillary lymph nodes, multiple additional pulmonary nodules, a probable left hepatic metastasis and possible bilateral adrenal metastases. Findings are consistent with widespread lung cancer. Tissue sampling recommended. 2. Sclerosis of the T1 vertebral body, also suspicious for metastatic disease. 3. Aortic Atherosclerosis (ICD10-I70.0) and Emphysema (ICD10-J43.9). 4. These results will be called to the ordering clinician or representative by the Radiologist Assistant, and communication documented in the PACS or zVision Dashboard.  He has a long smoking history.  He also reports alcohol use in marijuana.  Patient is seen today for consultation due to abnormal scan findings.Labs done 07/30/2018 reviewed and showed WBC 11.3 HB 13 plts 333,000.  Chemistries WNL with K+ 3.2 Cr 0.73 and normal LFTS.    I have discussed the case with Dr. Jacqualyn Posey of IR.  Patient is set up for PET scan and MRI of the brain for evaluation due to some of his neurological symptoms.  Pending PET results he will be set up for biopsy.  I discussed with the patient and his family findings are suspicious for advanced malignancy.   Discussion concerning treatment options will be held with the patient once results of his biopsy and scan have been reviewed.  Will facilitate diagnostic evaluation and follow-up.  All questions answered and patient expressed understanding of the information presented.  2.   Dizziness.  BP is 128/59.  He is alert and answers questions appropriately.  He is set for MRI of brain due to symptoms and findings that appear concerning for advanced malignancy.    3.  HTN.  BP is 128/59.  Follow-up with PCP.    4  Back pain/Weight loss.  Likely due to scan findings that are concerning for advanced malignancy.  He is on Relafen.  Will monitor for ongoing symptoms once therapy starts.  Pt reports history of ETOH and marijuana use.  Social work evaluation and nutrition evaluation on RTC.    60 minutes spent with more than 50% spent in counseling and coordination of care.    HPI: 67 year old male referred for evaluation due to abnormal scan.  He was complaining of upper back pain for several months.  He also reported weight loss, dizziness.  Patient had a CT of the chest done 07/24/2018 that showed IMPRESSION: 1. Dominant spiculated left upper lobe mass with multiple enlarged mediastinal, hilar and left axillary lymph nodes, multiple additional pulmonary nodules, a probable left hepatic metastasis and possible bilateral adrenal metastases. Findings are consistent with widespread lung cancer. Tissue sampling recommended. 2. Sclerosis of the T1 vertebral body, also suspicious for metastatic disease. 3. Aortic Atherosclerosis (ICD10-I70.0) and Emphysema (ICD10-J43.9). 4. These results will be called to the ordering clinician or representative by the Radiologist Assistant, and communication documented in the PACS or zVision Dashboard.  He has a long smoking  history.  He also reports alcohol use in marijuana.  Patient is seen today for consultation due to abnormal scan findings.  Problem List Patient Active  Problem List   Diagnosis Date Noted  . Anemia [D64.9] 04/09/2018  . Dark stools [R19.5] 04/09/2018  . RECTAL BLEEDING [K62.5] 05/31/2010  . ANOREXIA [R63.0] 05/31/2010  . WEIGHT LOSS, RECENT [R63.4] 05/31/2010    Past Medical History Past Medical History:  Diagnosis Date  . BPH (benign prostatic hyperplasia)   . Fracture   . Hypertension     Past Surgical History Past Surgical History:  Procedure Laterality Date  . APPENDECTOMY    . EYE SURGERY  1975  . HAND SURGERY    . HERNIA REPAIR  2015    Family History Family History  Problem Relation Age of Onset  . Colon cancer Neg Hx      Social History  reports that he has been smoking cigarettes. He has been smoking about 0.50 packs per day. He has quit using smokeless tobacco.  His smokeless tobacco use included chew. He reports that he drinks alcohol. He reports that he has current or past drug history. Drugs: Marijuana and "Crack" cocaine.  Medications  Current Outpatient Medications:  .  amLODipine (NORVASC) 10 MG tablet, Take 1 tablet by mouth daily., Disp: , Rfl:  .  atorvastatin (LIPITOR) 40 MG tablet, Take 1 tablet by mouth at bedtime. , Disp: , Rfl:  .  clopidogrel (PLAVIX) 75 MG tablet, Take 1 tablet by mouth daily., Disp: , Rfl:  .  dutasteride (AVODART) 0.5 MG capsule, Take 1 capsule by mouth daily., Disp: , Rfl:  .  ferrous sulfate 325 (65 FE) MG tablet, Take 325 mg by mouth 2 (two) times daily with a meal., Disp: , Rfl:  .  lisinopril-hydrochlorothiazide (PRINZIDE,ZESTORETIC) 20-12.5 MG tablet, Take 1 tablet by mouth 2 (two) times daily. , Disp: , Rfl:  .  nabumetone (RELAFEN) 750 MG tablet, Take 1 tablet by mouth 2 (two) times daily., Disp: , Rfl:  .  omeprazole (PRILOSEC) 20 MG capsule, Take 1 capsule by mouth daily., Disp: , Rfl:  .  tamsulosin (FLOMAX) 0.4 MG CAPS capsule, Take 1 tablet by mouth daily., Disp: , Rfl:   Allergies Patient has no known allergies.  Review of Systems Review of Systems -  Oncology ROS negative other than dizziness, back pain, weight loss.     Physical Exam  Vitals Wt Readings from Last 3 Encounters:  07/30/18 107 lb 11.2 oz (48.9 kg)  07/09/18 108 lb (49 kg)  04/09/18 115 lb 9.6 oz (52.4 kg)   Temp Readings from Last 3 Encounters:  07/30/18 98.4 F (36.9 C) (Oral)  07/09/18 (!) 96.6 F (35.9 C) (Oral)  04/09/18 (!) 97.2 F (36.2 C) (Oral)   BP Readings from Last 3 Encounters:  07/30/18 (!) 128/59  07/09/18 130/76  04/09/18 106/60   Pulse Readings from Last 3 Encounters:  07/30/18 68  07/09/18 62  04/09/18 86   Constitutional: Well-developed, well-nourished, and in no distress.   HENT: Head: Normocephalic and atraumatic.  Mouth/Throat: No oropharyngeal exudate. Mucosa moist. Eyes: Pupils are equal, round, and reactive to light. Conjunctivae are normal. No scleral icterus.  Neck: Normal range of motion. Neck supple. No JVD present.  Cardiovascular: Normal rate, regular rhythm and normal heart sounds.  Exam reveals no gallop and no friction rub.   No murmur heard. Pulmonary/Chest: Effort normal and breath sounds normal. No respiratory distress. No wheezes.No rales.  Abdominal: Soft. Bowel sounds  are normal. No distension. There is no tenderness. There is no guarding.  Musculoskeletal: No edema or tenderness.  Lymphadenopathy: No cervical, axillary or supraclavicular adenopathy.  Neurological: Alert and oriented to person, place, and time. No cranial nerve deficit.  Skin: Skin is warm and dry. No rash noted. No erythema. No pallor.  Psychiatric: Affect and judgment normal.   Labs Appointment on 07/30/2018  Component Date Value Ref Range Status  . WBC 07/30/2018 11.3* 4.0 - 10.5 K/uL Final  . RBC 07/30/2018 4.12* 4.22 - 5.81 MIL/uL Final  . Hemoglobin 07/30/2018 13.1  13.0 - 17.0 g/dL Final  . HCT 07/30/2018 38.3* 39.0 - 52.0 % Final  . MCV 07/30/2018 93.0  78.0 - 100.0 fL Final  . MCH 07/30/2018 31.8  26.0 - 34.0 pg Final  . MCHC  07/30/2018 34.2  30.0 - 36.0 g/dL Final  . RDW 07/30/2018 13.8  11.5 - 15.5 % Final  . Platelets 07/30/2018 333  150 - 400 K/uL Final  . Neutrophils Relative % 07/30/2018 65  % Final  . Neutro Abs 07/30/2018 7.3  1.7 - 7.7 K/uL Final  . Lymphocytes Relative 07/30/2018 23  % Final  . Lymphs Abs 07/30/2018 2.6  0.7 - 4.0 K/uL Final  . Monocytes Relative 07/30/2018 12  % Final  . Monocytes Absolute 07/30/2018 1.3* 0.1 - 1.0 K/uL Final  . Eosinophils Relative 07/30/2018 0  % Final  . Eosinophils Absolute 07/30/2018 0.0  0.0 - 0.7 K/uL Final  . Basophils Relative 07/30/2018 0  % Final  . Basophils Absolute 07/30/2018 0.0  0.0 - 0.1 K/uL Final   Performed at [facility], [address] [place], [place] [address]  . Sodium 07/30/2018 131* 135 - 145 mmol/L Final  . Potassium 07/30/2018 3.2* 3.5 - 5.1 mmol/L Final  . Chloride 07/30/2018 94* 98 - 111 mmol/L Final  . CO2 07/30/2018 26  22 - 32 mmol/L Final  . Glucose, Bld 07/30/2018 78  70 - 99 mg/dL Final  . BUN 07/30/2018 10  8 - 23 mg/dL Final  . Creatinine, Ser 07/30/2018 0.73  0.61 - 1.24 mg/dL Final  . Calcium 07/30/2018 9.5  8.9 - 10.3 mg/dL Final  . Total Protein 07/30/2018 7.7  6.5 - 8.1 g/dL Final  . Albumin 07/30/2018 3.6  3.5 - 5.0 g/dL Final  . AST 07/30/2018 17  15 - 41 U/L Final  . ALT 07/30/2018 10  0 - 44 U/L Final  . Alkaline Phosphatase 07/30/2018 101  38 - 126 U/L Final  . Total Bilirubin 07/30/2018 0.6  0.3 - 1.2 mg/dL Final  . GFR calc non Af Amer 07/30/2018 >60  >60 mL/min Final  . GFR calc Af Amer 07/30/2018 >60  >60 mL/min Final   Comment: (NOTE) The eGFR has been calculated using the CKD EPI equation. This calculation has not been validated in all clinical situations. eGFR's persistently <60 mL/min signify possible Chronic Kidney Disease.   Georgiann Hahn gap 07/30/2018 11  5 - 15 Final   Performed at [facility], [address], [place], [place] [address]  . LDH 07/30/2018 107  98 - 192 U/L Final   Performed at [facility], [address], [place], [place] [address]     Pathology Orders Placed This Encounter  Procedures  . MR Brain W Wo Contrast    Standing Status:   Future    Standing Expiration Date:   07/30/2019    Order Specific Question:   If indicated for the ordered  procedure, I authorize the administration of contrast media per Radiology protocol    Answer:   Yes    Order Specific Question:   What is the patient's sedation requirement?    Answer:   No Sedation    Order Specific Question:   Does the patient have a pacemaker or implanted devices?    Answer:   No    Order Specific Question:   Use SRS Protocol?    Answer:   No    Order Specific Question:   Radiology Contrast Protocol - do NOT remove file path    Answer:   \\[email or web address]    Order Specific Question:   Preferred imaging location?    Answer:   [facility] (table limit-350 lbs)  . NM PET Image Initial (PI) Skull Base To Thigh    Standing Status:   Future    Standing Expiration Date:   07/30/2019    Order Specific Question:   If indicated for the ordered procedure, I authorize the administration of a radiopharmaceutical per Radiology protocol    Answer:   Yes    Order Specific Question:   Preferred imaging location?    Answer:   [facility]    Order Specific Question:   Radiology Contrast Protocol - do NOT remove file path    Answer:   \\[email or web address]  . CT Biopsy    Standing Status:   Future    Standing Expiration Date:   07/30/2019    Order Specific Question:   Lab orders requested (DO NOT place separate lab orders, these will be automatically ordered during procedure specimen collection):    Answer:   Surgical Pathology    Order Specific Question:   Reason for Exam (SYMPTOM  OR DIAGNOSIS REQUIRED)    Answer:   abnormal scan    Order Specific Question:   Preferred imaging location?    Answer:   [facility]    Order Specific Question:    Radiology Contrast Protocol - do NOT remove file path    Answer:   \\[email or web address]  . CBC with Differential/Platelet    Standing Status:   Future    Number of Occurrences:   1    Standing Expiration Date:   07/31/2019  . Comprehensive metabolic panel    Standing Status:   Future    Number of Occurrences:   1    Standing Expiration Date:   07/31/2019  . Lactate dehydrogenase    Standing Status:   Future    Number of Occurrences:   1    Standing Expiration Date:   07/31/2019       Zoila Shutter MD

## 2018-08-12 ENCOUNTER — Ambulatory Visit (HOSPITAL_COMMUNITY)
Admission: RE | Admit: 2018-08-12 | Discharge: 2018-08-12 | Disposition: A | Discharge status: Home / Self Care | Payer: Medicare HMO | Source: Ambulatory Visit | Attending: Internal Medicine | Admitting: Internal Medicine

## 2018-08-12 ENCOUNTER — Ambulatory Visit (HOSPITAL_COMMUNITY)
Admission: RE | Admit: 2018-08-12 | Discharge: 2018-08-12 | Disposition: A | Discharge status: Home / Self Care | Payer: Medicare HMO | Source: Ambulatory Visit | Attending: Nurse Practitioner | Admitting: Nurse Practitioner

## 2018-08-12 ENCOUNTER — Other Ambulatory Visit (HOSPITAL_COMMUNITY): Payer: Self-pay | Admitting: Nurse Practitioner

## 2018-08-12 DIAGNOSIS — C7931 Secondary malignant neoplasm of brain: Secondary | ICD-10-CM | POA: Insufficient documentation

## 2018-08-12 DIAGNOSIS — C7951 Secondary malignant neoplasm of bone: Secondary | ICD-10-CM | POA: Diagnosis not present

## 2018-08-12 DIAGNOSIS — C7972 Secondary malignant neoplasm of left adrenal gland: Secondary | ICD-10-CM | POA: Diagnosis not present

## 2018-08-12 DIAGNOSIS — Z135 Encounter for screening for eye and ear disorders: Secondary | ICD-10-CM | POA: Diagnosis not present

## 2018-08-12 DIAGNOSIS — T1590XA Foreign body on external eye, part unspecified, unspecified eye, initial encounter: Secondary | ICD-10-CM | POA: Insufficient documentation

## 2018-08-12 DIAGNOSIS — G4489 Other headache syndrome: Secondary | ICD-10-CM | POA: Insufficient documentation

## 2018-08-12 DIAGNOSIS — C7971 Secondary malignant neoplasm of right adrenal gland: Secondary | ICD-10-CM | POA: Diagnosis not present

## 2018-08-12 DIAGNOSIS — R911 Solitary pulmonary nodule: Secondary | ICD-10-CM | POA: Insufficient documentation

## 2018-08-12 DIAGNOSIS — C787 Secondary malignant neoplasm of liver and intrahepatic bile duct: Secondary | ICD-10-CM | POA: Diagnosis not present

## 2018-08-12 MED ORDER — FLUDEOXYGLUCOSE F - 18 (FDG) INJECTION
7.3000 | Freq: Once | INTRAVENOUS | Status: AC | PRN
  Administered 2018-08-12: 7.3 via INTRAVENOUS

## 2018-08-12 MED ORDER — GADOBUTROL 1 MMOL/ML IV SOLN
4.5000 mL | Freq: Once | INTRAVENOUS | Status: AC | PRN
  Administered 2018-08-12: 4.5 mL via INTRAVENOUS

## 2018-08-14 ENCOUNTER — Ambulatory Visit (HOSPITAL_COMMUNITY): Payer: Medicare HMO | Admitting: Internal Medicine

## 2018-08-14 ENCOUNTER — Other Ambulatory Visit (HOSPITAL_COMMUNITY): Payer: Self-pay | Admitting: Nurse Practitioner

## 2018-08-14 DIAGNOSIS — G9389 Other specified disorders of brain: Secondary | ICD-10-CM

## 2018-08-14 MED ORDER — DEXAMETHASONE 4 MG PO TABS: 4.0000 mg | ORAL_TABLET | Freq: Two times a day (BID) | ORAL | 1 refills | Status: DC

## 2018-08-15 ENCOUNTER — Encounter: Payer: Self-pay | Admitting: Radiation Oncology

## 2018-08-16 ENCOUNTER — Ambulatory Visit (HOSPITAL_COMMUNITY): Payer: Medicare HMO | Admitting: Internal Medicine

## 2018-08-19 ENCOUNTER — Encounter (HOSPITAL_COMMUNITY): Payer: Self-pay | Admitting: Internal Medicine

## 2018-08-19 ENCOUNTER — Inpatient Hospital Stay (HOSPITAL_COMMUNITY): Payer: Medicare HMO | Attending: Internal Medicine | Admitting: Internal Medicine

## 2018-08-19 ENCOUNTER — Other Ambulatory Visit: Payer: Self-pay

## 2018-08-19 ENCOUNTER — Encounter (HOSPITAL_COMMUNITY): Payer: Self-pay | Admitting: *Deleted

## 2018-08-19 VITALS — BP 131/69 | HR 84 | Resp 24

## 2018-08-19 DIAGNOSIS — R918 Other nonspecific abnormal finding of lung field: Secondary | ICD-10-CM | POA: Diagnosis not present

## 2018-08-19 DIAGNOSIS — R599 Enlarged lymph nodes, unspecified: Secondary | ICD-10-CM | POA: Diagnosis not present

## 2018-08-19 DIAGNOSIS — R911 Solitary pulmonary nodule: Secondary | ICD-10-CM

## 2018-08-19 DIAGNOSIS — R42 Dizziness and giddiness: Secondary | ICD-10-CM | POA: Diagnosis not present

## 2018-08-19 DIAGNOSIS — C3412 Malignant neoplasm of upper lobe, left bronchus or lung: Secondary | ICD-10-CM | POA: Diagnosis not present

## 2018-08-19 DIAGNOSIS — R9389 Abnormal findings on diagnostic imaging of other specified body structures: Secondary | ICD-10-CM | POA: Diagnosis present

## 2018-08-19 DIAGNOSIS — C7951 Secondary malignant neoplasm of bone: Secondary | ICD-10-CM

## 2018-08-19 DIAGNOSIS — C7931 Secondary malignant neoplasm of brain: Secondary | ICD-10-CM | POA: Insufficient documentation

## 2018-08-19 DIAGNOSIS — Z720 Tobacco use: Secondary | ICD-10-CM | POA: Diagnosis not present

## 2018-08-19 DIAGNOSIS — I10 Essential (primary) hypertension: Secondary | ICD-10-CM | POA: Diagnosis not present

## 2018-08-19 MED ORDER — MORPHINE SULFATE 15 MG PO TABS: 15.0000 mg | ORAL_TABLET | ORAL | 0 refills | Status: DC | PRN

## 2018-08-19 MED ORDER — MORPHINE SULFATE ER 30 MG PO TBCR: 30.0000 mg | EXTENDED_RELEASE_TABLET | Freq: Two times a day (BID) | ORAL | 0 refills | Status: DC

## 2018-08-19 MED ORDER — OXYCODONE-ACETAMINOPHEN 5-325 MG PO TABS
2.0000 | ORAL_TABLET | Freq: Once | ORAL | Status: AC
  Administered 2018-08-19: 2 via ORAL

## 2018-08-19 NOTE — Progress Notes (Signed)
Per Dr. Walden Field, patient is to stop taking his Plavix on Wednesday pending his biopsy next Monday.   I called and spoke with Hilda Blades, patient's sister, and advised of the need to stop plavix.  She verbalizes understanding and states that she helps him with his medication and will ensure that he stops it.

## 2018-08-19 NOTE — Progress Notes (Signed)
Location/Histology of Brain Tumor: Widespread lung cancer, multiple brain mets  Patient presented with symptoms of:  Upper back pain for several months, weight loss, and dizziness. Headaches over the last 2 months.  He states the pain radiates around to his chest.  PET 1/61/0960: Hypermetabolic mass is seen on the left measuring 2.9 x 1.7 cm, large hypermetabolic soft tissue mass in the left upper lobe measures 7.4 x 3.3 cm and shows direct mediastinal invasion in the aorto-pulmonary window. 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  MRI Brain 08/12/2018: There are multiple brain metastases, with associated vasogenic edema.  Edema is most pronounced in the right frontal and temporal lobe and present to a lesser extent in the left frontal lobe.  There is mild mass effect with right to left shift of 1 or 2 mm. Cerebellum 6 mm metastasis in the right hemisphere, Punctate metastasis in the right hemisphere, 4 mm metastasis in the right hemisphere, and 4 mm metastasis in the vermis. Right Hemisphere: 17 mm metastasis within the temporal lobe, 10 mm right frontal metastasis, punctate metastasis in the right occipital lobe, 4 mm metastasis in the right parietal lobe, 2 adjacent punctate metastases in the right frontal lobe, 16 mm metastasis at the right frontoparietal vertex. Left Hemisphere: 5 mm metastasis at the left temporal tip, 9 mm metastasis in the lateral temporal lobe, 19 mm metastasis in the left frontal lobe, 4 mm metastasis in the left frontal lobe.  CT Chest 07/24/2018: Dominant spiculated left upper lobe mass with multiple enlarged mediastinal, hilar, and left axillary lymph nodes, multiple pulmonary nodules, a probable left hepatic metastasis and possible bilateral adrenal metastases.  Findings are consistent with widespread lung cancer.  Past or anticipated interventions, if any,  per neurosurgery:   Past or anticipated interventions, if any, per medical oncology:  Dr. Walden Field 07/30/2018 -I have discussed the case with Dr. Jacqualyn Posey of IR.  Patient is set up for PET scan and MRI brain for evaluation due to some of his neurological symptoms. -Pending PET results he will be set up for biopsy. - I discussed with the patient and his family findings are suspicious for advanced malignancy. -Discussion concerning treatment options will be held with the patient once results of his biopsy and scan have been reviewed.  Dose of Decadron, if applicable: 4 mg BID  Recent neurologic symptoms, if any:   Seizures: No  Headaches: No  Nausea: No  Dizziness/ataxia: Yes, may be in relation to the morphine he just started.  Difficulty with hand coordination: No  Focal numbness/weakness: some numbness/tingling in his hands.  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    Confusion/Memory deficits: Little bit  BP 120/66 (BP Location: Right Arm, Patient Position: Sitting)   Pulse 100   Temp 98.1 F (36.7 C) (Oral)   Resp 20   Ht 5\' 3"  (1.6 m)   Wt 104 lb 9.6 oz (47.4 kg)   SpO2 100%   BMI 18.53 kg/m    Wt Readings from Last 3 Encounters:  08/20/18 104 lb 9.6 oz (47.4 kg)  07/30/18 107 lb 11.2 oz (48.9 kg)  07/09/18 108 lb (49 kg)    SAFETY ISSUES:  Prior radiation? No  Pacemaker/ICD? No  Possible current pregnancy? No  Is the patient on methotrexate? No  Additional Complaints / other details:

## 2018-08-19 NOTE — Progress Notes (Signed)
Diagnosis Abnormal CAT scan - Plan: CANCELED: US Guided Needle Placement  Staging Cancer Staging No matching staging information was found for the patient.  Assessment and Plan:  1.  Abnormal scan findings.  Patient was complaining of several month history of back pain.  He had a CT of the chest done 07/24/2018 that showed IMPRESSION: 1. Dominant spiculated left upper lobe mass with multiple enlarged mediastinal, hilar and left axillary lymph nodes, multiple additional pulmonary nodules, a probable left hepatic metastasis and possible bilateral adrenal metastases. Findings are consistent with widespread lung cancer. Tissue sampling recommended. 2. Sclerosis of the T1 vertebral body, also suspicious for metastatic disease. 3. Aortic Atherosclerosis (ICD10-I70.0) and Emphysema (ICD10-J43.9). 4. These results will be called to the ordering clinician or representative by the Radiologist Assistant, and communication documented in the PACS or zVision Dashboard.  He has a long smoking history.  He also reports alcohol use and marijuana.    Pet scan done 08/12/2018 reviewed and showed IMPRESSION: 7.4 cm hypermetabolic mass in the left upper lobe, consistent with primary lung carcinoma.  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  MRI of brain done 08/12/2018 reviewed and showed  Impression:   Multiple brain metastases scattered throughout the cerebellum and both cerebral hemispheres. Vasogenic edema most pronounced in the right temporal lobe and right frontal lobe and to a lesser extent in the left frontal lobe. Mild mass effect with right-to-left shift of 1 or 2 mm.  Total of 4 cerebellar metastases identified, the largest measuring 6 mm in the right hemisphere. Total of 8 metastases identified in the right cerebral hemisphere, the largest measuring 17 mm in the  right temporal lobe. Total of 4 metastases identified in the left hemisphere, the largest measuring 19 mm in the left frontal lobe.  There is considerable motion degradation, and small metastatic lesions may be inapparent.  Few old scattered cortical infarctions.  I have again discussed the case with Dr. Jacqualyn Posey of IR.  Patient is set up for biopsy of adenopathy noted most likely in the cervical area for diagnostic purposes.  He is instructed to hold plavix for 5-7 days for procedure.  He has also been referred to RT.  Pt has findings that are concerning of for advanced malignancy.  He will RTC to go over biopsy results.   All questions answered and patient expressed understanding of the information presented.  2.   Dizziness.  Pt was set up for MRI of brain that showed findings concerning of brain mets.  He is on Decadron.  He is scheduled to be seen by RT for evaluation.    3.  Pain.  Pt was given Percocet 10 mg in clinic today.  Pain improved with medicaiton.  He is Rxd  MS contin 30 mg po bid and MSIR 15 mg q 4 hrs prn for breakthrough.  I have recommended family help with medication administration.    4.  HTN.  BP 131/69.  Follow-up with PCP.    5.  Bone mets.  He will be put on Xgeva once biopsy confirms malignancy.    30 minutes spent with more than 50% spent in counseling and coordination of care.    Interval history:  Historical data obtained from the note dated 07/30/2018:  67 year old male referred for evaluation due to abnormal scan.  He was complaining of upper back pain for several months.  He also reported weight loss, dizziness.  Patient had a CT of the chest done 07/24/2018 that showed IMPRESSION: 1. Dominant spiculated left upper lobe mass with multiple enlarged mediastinal, hilar and left axillary lymph nodes, multiple additional pulmonary nodules, a probable left hepatic metastasis and possible bilateral adrenal metastases. Findings are consistent with widespread lung  cancer. Tissue sampling recommended. 2. Sclerosis of the T1 vertebral body, also suspicious for metastatic disease. 3. Aortic Atherosclerosis (ICD10-I70.0) and Emphysema (ICD10-J43.9). 4. These results will be called to the ordering clinician or representative by the Radiologist Assistant, and communication documented in the PACS or zVision Dashboard.  He has a long smoking history.  He also reports alcohol use in marijuana.    Current Status:  Pt is seen today for follow-up to go over PET scan and MRI of brain.  He is complaining of severe pain.     Problem List Patient Active Problem List   Diagnosis Date Noted  . Anemia [D64.9] 04/09/2018  . Dark stools [R19.5] 04/09/2018  . RECTAL BLEEDING [K62.5] 05/31/2010  . ANOREXIA [R63.0] 05/31/2010  . WEIGHT LOSS, RECENT [R63.4] 05/31/2010    Past Medical History Past Medical History:  Diagnosis Date  . BPH (benign prostatic hyperplasia)   . Fracture   . Hypertension     Past Surgical History Past Surgical History:  Procedure Laterality Date  . APPENDECTOMY    . EYE SURGERY  1975  . HAND SURGERY    . HERNIA REPAIR  2015    Family History Family History  Problem Relation Age of Onset  . Colon cancer Neg Hx      Social History  reports that he has been smoking cigarettes. He has been smoking about 0.50 packs per day. He has quit using smokeless tobacco.  His smokeless tobacco use included chew. He reports that he drinks alcohol. He reports that he has current or past drug history. Drugs: Marijuana and "Crack" cocaine.  Medications  Current Outpatient Medications:  .  amLODipine (NORVASC) 10 MG tablet, Take 1 tablet by mouth daily., Disp: , Rfl:  .  atorvastatin (LIPITOR) 40 MG tablet, Take 1 tablet by mouth at bedtime. , Disp: , Rfl:  .  clopidogrel (PLAVIX) 75 MG tablet, Take 1 tablet by mouth daily., Disp: , Rfl:  .  dexamethasone (DECADRON) 4 MG tablet, Take 1 tablet (4 mg total) by mouth 2 (two) times daily., Disp:  60 tablet, Rfl: 1 .  dutasteride (AVODART) 0.5 MG capsule, Take 1 capsule by mouth daily., Disp: , Rfl:  .  ferrous sulfate 325 (65 FE) MG tablet, Take 325 mg by mouth 2 (two) times daily with a meal., Disp: , Rfl:  .  lisinopril-hydrochlorothiazide (PRINZIDE,ZESTORETIC) 20-12.5 MG tablet, Take 1 tablet by mouth 2 (two) times daily. , Disp: , Rfl:  .  nabumetone (RELAFEN) 750 MG tablet, Take 1 tablet by mouth 2 (two) times daily., Disp: , Rfl:  .  omeprazole (PRILOSEC) 20 MG capsule, Take 1 capsule by mouth daily., Disp: , Rfl:  .  tamsulosin (FLOMAX) 0.4 MG CAPS capsule, Take 1 tablet by mouth daily., Disp: , Rfl:  .  morphine (MS CONTIN) 30 MG 12 hr tablet, Take 1 tablet (30 mg total) by mouth every 12 (twelve) hours., Disp: 60 tablet, Rfl: 0 .  morphine (MSIR) 15 MG tablet, Take 1 tablet (15 mg total) by mouth every 4 (four) hours as needed for severe pain., Disp: 90 tablet, Rfl: 0  Allergies Patient has no known allergies.  Review of Systems Review of Systems - Oncology  ROS pain   Physical Exam  Vitals Wt Readings from Last 3 Encounters:  07/30/18 107 lb 11.2 oz (48.9 kg)  07/09/18 108 lb (49 kg)  04/09/18 115 lb 9.6 oz (52.4 kg)   Temp Readings from Last 3 Encounters:  07/30/18 98.4 F (36.9 C) (Oral)  07/09/18 (!) 96.6 F (35.9 C) (Oral)  04/09/18 (!) 97.2 F (36.2 C) (Oral)   BP Readings from Last 3 Encounters:  08/19/18 131/69  07/30/18 (!) 128/59  07/09/18 130/76   Pulse Readings from Last 3 Encounters:  08/19/18 84  07/30/18 68  07/09/18 62   Constitutional: Well-developed, well-nourished, and in no distress.   HENT: Head: Normocephalic and atraumatic.  Mouth/Throat: No oropharyngeal exudate. Mucosa moist. Eyes: Pupils are equal, round, and reactive to light. Conjunctivae are normal. No scleral icterus.  Neck: Normal range of motion. Neck supple. No JVD present.  Cardiovascular: Normal rate, regular rhythm and normal heart sounds.  Exam reveals no gallop and  no friction rub.   No murmur heard. Pulmonary/Chest: Effort normal and breath sounds normal. No respiratory distress. No wheezes.No rales.  Abdominal: Soft. Bowel sounds are normal. No distension. There is no tenderness. There is no guarding.  Musculoskeletal: No edema or tenderness.  Lymphadenopathy: No cervical, axillary or supraclavicular adenopathy.  Neurological: Alert and oriented to person, place, and time. No cranial nerve deficit.  Skin: Skin is warm and dry. No rash noted. No erythema. No pallor.  Psychiatric: Affect and judgment normal.   Labs No visits with results within 3 Day(s) from this visit.  Latest known visit with results is:  Appointment on 07/30/2018  Component Date Value Ref Range Status  . WBC 07/30/2018 11.3* 4.0 - 10.5 K/uL Final  . RBC 07/30/2018 4.12* 4.22 - 5.81 MIL/uL Final  . Hemoglobin 07/30/2018 13.1  13.0 - 17.0 g/dL Final  . HCT 07/30/2018 38.3* 39.0 - 52.0 % Final  . MCV 07/30/2018 93.0  78.0 - 100.0 fL Final  . MCH 07/30/2018 31.8  26.0 - 34.0 pg Final  . MCHC 07/30/2018 34.2  30.0 - 36.0 g/dL Final  . RDW 07/30/2018 13.8  11.5 - 15.5 % Final  . Platelets 07/30/2018 333  150 - 400 K/uL Final  . Neutrophils Relative % 07/30/2018 65  % Final  . Neutro Abs 07/30/2018 7.3  1.7 - 7.7 K/uL Final  . Lymphocytes Relative 07/30/2018 23  % Final  . Lymphs Abs 07/30/2018 2.6  0.7 - 4.0 K/uL Final  . Monocytes Relative 07/30/2018 12  % Final  . Monocytes Absolute 07/30/2018 1.3* 0.1 - 1.0 K/uL Final  . Eosinophils Relative 07/30/2018 0  % Final  . Eosinophils Absolute 07/30/2018 0.0  0.0 - 0.7 K/uL Final  . Basophils Relative 07/30/2018 0  % Final  . Basophils Absolute 07/30/2018 0.0  0.0 - 0.1 K/uL Final   Performed at [facility], [address] [place], [place] [address]  . Sodium 07/30/2018 131* 135 - 145 mmol/L Final  . Potassium 07/30/2018 3.2* 3.5 - 5.1 mmol/L Final  . Chloride 07/30/2018 94* 98 - 111 mmol/L Final  . CO2 07/30/2018 26  22 - 32  mmol/L Final  . Glucose, Bld 07/30/2018 78  70 - 99 mg/dL Final  . BUN 07/30/2018 10  8 - 23 mg/dL Final  . Creatinine, Ser 07/30/2018 0.73  0.61 - 1.24 mg/dL Final  . Calcium 07/30/2018 9.5  8.9 - 10.3 mg/dL Final  . Total Protein 07/30/2018 7.7  6.5 - 8.1 g/dL Final  .  Albumin 07/30/2018 3.6  3.5 - 5.0 g/dL Final  . AST 07/30/2018 17  15 - 41 U/L Final  . ALT 07/30/2018 10  0 - 44 U/L Final  . Alkaline Phosphatase 07/30/2018 101  38 - 126 U/L Final  . Total Bilirubin 07/30/2018 0.6  0.3 - 1.2 mg/dL Final  . GFR calc non Af Amer 07/30/2018 >60  >60 mL/min Final  . GFR calc Af Amer 07/30/2018 >60  >60 mL/min Final   Comment: (NOTE) The eGFR has been calculated using the CKD EPI equation. This calculation has not been validated in all clinical situations. eGFR's persistently <60 mL/min signify possible Chronic Kidney Disease.   Georgiann Hahn gap 07/30/2018 11  5 - 15 Final   Performed at [facility], [address], [place], [place] [address]  . LDH 07/30/2018 107  98 - 192 U/L Final   Performed at [facility], [address], [place], [place] [address]     Pathology No orders of the defined types were placed in this encounter.      Zoila Shutter MD

## 2018-08-20 ENCOUNTER — Encounter (HOSPITAL_COMMUNITY): Payer: Self-pay | Admitting: General Practice

## 2018-08-20 ENCOUNTER — Ambulatory Visit
Admission: RE | Admit: 2018-08-20 | Discharge: 2018-08-20 | Disposition: A | Discharge status: Home / Self Care | Payer: Medicare HMO | Source: Ambulatory Visit | Attending: Radiation Oncology | Admitting: Radiation Oncology

## 2018-08-20 ENCOUNTER — Other Ambulatory Visit: Payer: Self-pay

## 2018-08-20 ENCOUNTER — Encounter: Payer: Self-pay | Admitting: Radiation Oncology

## 2018-08-20 VITALS — BP 120/66 | HR 100 | Temp 98.1°F | Resp 20 | Ht 63.0 in | Wt 104.6 lb

## 2018-08-20 DIAGNOSIS — C7931 Secondary malignant neoplasm of brain: Secondary | ICD-10-CM | POA: Insufficient documentation

## 2018-08-20 DIAGNOSIS — Z79899 Other long term (current) drug therapy: Secondary | ICD-10-CM | POA: Diagnosis not present

## 2018-08-20 DIAGNOSIS — F17210 Nicotine dependence, cigarettes, uncomplicated: Secondary | ICD-10-CM | POA: Diagnosis not present

## 2018-08-20 DIAGNOSIS — Z7902 Long term (current) use of antithrombotics/antiplatelets: Secondary | ICD-10-CM | POA: Insufficient documentation

## 2018-08-20 DIAGNOSIS — C3490 Malignant neoplasm of unspecified part of unspecified bronchus or lung: Secondary | ICD-10-CM

## 2018-08-20 DIAGNOSIS — C787 Secondary malignant neoplasm of liver and intrahepatic bile duct: Secondary | ICD-10-CM | POA: Diagnosis not present

## 2018-08-20 NOTE — Progress Notes (Addendum)
Radiation Oncology         [phone] ________________________________  Name: Gregory Hickman        MRN: [id number]  Date of Service: 08/20/2018 DOB: [birth year]  TK:[id number], Lehman Prom, FNP  Derek Jack, MD     REFERRING PHYSICIAN: Derek Jack, MD   DIAGNOSIS: The encounter diagnosis was Primary malignant neoplasm of lung with metastasis to brain [facility]).   HISTORY OF PRESENT ILLNESS: Gregory Hickman is a 67 y.o. male seen at the request of Dr. Delton Coombes for a new diagnosis of probable stage IV lung cancer. The patient was seen originally after he complained of upper back pain and dizziness for several months. His work up included a CT scan of the chest on 07/24/2018 revealing a dominant spiculated left upper lobe mass with multiple enlarged mediastinal, hilar, left axillary nodes and multiple additional pulmonary nodules concerns for disease in the left aspect of the liver and adrenal metastases bilaterally.  Sclerosis of T1 vertebral body was suspicious for disease, and he underwent an MRI of the brain on 08/12/2018 revealing multiple brain metastases scattered throughout the cerebellum in both cerebral hemispheres with vasogenic edema pronounced mostly in the right temporal lobe and right frontal lobe to a lesser degree in the left frontal lobe.  Mild mass-effect with right-to-left midline shift of 1 2 mm were noted.  The largest lesion was 17 mm in the right temporal lobe, and he was started on dexamethasone 4 mg twice daily.  He underwent PET scan the same day revealing hypermetabolism within the cervical spine and upper thoracic spine, right scapula, left ilium, and hypermetabolism in the left upper lobe mass measuring 7.4 cm.  There was activity within the left liver lesion seen on CT imaging, bilateral adrenal glands, pancreatic tail and upper stomach.  He is scheduled to undergo a biopsy on Monday.    PREVIOUS RADIATION THERAPY: No   PAST MEDICAL HISTORY:  Past  Medical History:  Diagnosis Date  . BPH (benign prostatic hyperplasia)   . Fracture   . Hypertension        PAST SURGICAL HISTORY: Past Surgical History:  Procedure Laterality Date  . APPENDECTOMY    . EYE SURGERY  1975  . HAND SURGERY    . HERNIA REPAIR  2015     FAMILY HISTORY:  Family History  Problem Relation Age of Onset  . Colon cancer Neg Hx      SOCIAL HISTORY:  reports that he has been smoking cigarettes. He has been smoking about 0.50 packs per day. He has quit using smokeless tobacco.  His smokeless tobacco use included chew. He reports that he drinks alcohol. He reports that he has current or past drug history. Drugs: Marijuana and "Crack" cocaine.  Patient is going to be living in [place] with his sister and brother-in-law.  He was previously residing on his own, but his sister and brother-in-law say that they will be taking care of him and making sure that he is safe to go home when the time is right.   ALLERGIES: Patient has no known allergies.   MEDICATIONS:  Current Outpatient Medications  Medication Sig Dispense Refill  . amLODipine (NORVASC) 10 MG tablet Take 1 tablet by mouth daily.    Marland Kitchen atorvastatin (LIPITOR) 40 MG tablet Take 1 tablet by mouth at bedtime.     . clopidogrel (PLAVIX) 75 MG tablet Take 1 tablet by mouth daily.    Marland Kitchen dexamethasone (DECADRON) 4 MG tablet Take 1 tablet (4  mg total) by mouth 2 (two) times daily. 60 tablet 1  . ferrous sulfate 325 (65 FE) MG tablet Take 325 mg by mouth 2 (two) times daily with a meal.    . lisinopril-hydrochlorothiazide (PRINZIDE,ZESTORETIC) 20-12.5 MG tablet Take 1 tablet by mouth 2 (two) times daily.     Marland Kitchen morphine (MS CONTIN) 30 MG 12 hr tablet Take 1 tablet (30 mg total) by mouth every 12 (twelve) hours. 60 tablet 0  . morphine (MSIR) 15 MG tablet Take 1 tablet (15 mg total) by mouth every 4 (four) hours as needed for severe pain. 90 tablet 0  . nabumetone (RELAFEN) 750 MG tablet Take 1 tablet by mouth 2  (two) times daily.    Marland Kitchen omeprazole (PRILOSEC) 20 MG capsule Take 1 capsule by mouth daily.    . tamsulosin (FLOMAX) 0.4 MG CAPS capsule Take 1 tablet by mouth daily.    Marland Kitchen dutasteride (AVODART) 0.5 MG capsule Take 1 capsule by mouth daily.     No current facility-administered medications for this encounter.      REVIEW OF SYSTEMS: On review of systems, the patient reports that he is doing well overall. He reports though that he is having pain in his mid neck and to a lesser degree his right shoulder blade. He has some occasional discomfort in his left hip. He denies any chest pain, fevers, chills, night sweats.  He denies any shortness of breath at rest but with exertion, notes significant trouble with the symptom, he describes coughing and bringing up clear to white mucus.  Sometimes this is foamy.  He has lost about 40 pounds in the last 6 months.  He is accompanied by his sister and brother-in-law, and apparently he drinks a 40 ounce of beer most days, and also has smoked crack cocaine for the last 30 years.  The longest he is ever gone without smoking crack cocaine was a week.  He reports he last smoked about 4 days ago.  He denies any bowel or bladder disturbances, and denies abdominal pain, or vomiting.  No other complaints of musculoskeletal symptoms are noted.  He has been using his dexamethasone and has noticed improvement in his dizziness.  He denies any headaches at this time.  He does have occasional nauseousness, though this is also improved with steroid. A complete review of systems is obtained and is otherwise negative.     PHYSICAL EXAM:  Wt Readings from Last 3 Encounters:  08/20/18 104 lb 9.6 oz (47.4 kg)  07/30/18 107 lb 11.2 oz (48.9 kg)  07/09/18 108 lb (49 kg)   Temp Readings from Last 3 Encounters:  08/20/18 98.1 F (36.7 C) (Oral)  07/30/18 98.4 F (36.9 C) (Oral)  07/09/18 (!) 96.6 F (35.9 C) (Oral)   BP Readings from Last 3 Encounters:  08/20/18 120/66  08/19/18  131/69  07/30/18 (!) 128/59   Pulse Readings from Last 3 Encounters:  08/20/18 100  08/19/18 84  07/30/18 68   Pain Assessment Pain Score: 5  Pain Loc: Back(Pain from the base of skull down to his lower back.)/10  In general this is a chronically ill and cachectic appearing African-American male with poor dentition.  He is alert to self, time, and place though his insight and retention of discussion points is poor.  His sister repeats much of our discussion to him. He is appropriate throughout the examination. HEENT reveals that the patient is normocephalic, atraumatic. EOMs are intact, though he has lateral eye gaze of the  left eye. It is not fixed. Skin is intact without any evidence of gross lesions. Cardiopulmonary assessment is negative for acute distress and he exhibits normal effort.    ECOG = 1  0 - Asymptomatic (Fully active, able to carry on all predisease activities without restriction)  1 - Symptomatic but completely ambulatory (Restricted in physically strenuous activity but ambulatory and able to carry out work of a light or sedentary nature. For example, light housework, office work)  2 - Symptomatic, <50% in bed during the day (Ambulatory and capable of all self care but unable to carry out any work activities. Up and about more than 50% of waking hours)  3 - Symptomatic, >50% in bed, but not bedbound (Capable of only limited self-care, confined to bed or chair 50% or more of waking hours)  4 - Bedbound (Completely disabled. Cannot carry on any self-care. Totally confined to bed or chair)  5 - Death   Eustace Pen MM, Creech RH, Tormey DC, et al. [phone]). "Toxicity and response criteria of the [facility]". [place] Oncol. 5 (6): 649-55    LABORATORY DATA:  Lab Results  Component Value Date   WBC 11.3 (H) 07/30/2018   HGB 13.1 07/30/2018   HCT 38.3 (L) 07/30/2018   MCV 93.0 07/30/2018   PLT 333 07/30/2018   Lab Results  Component Value Date    NA 131 (L) 07/30/2018   K 3.2 (L) 07/30/2018   CL 94 (L) 07/30/2018   CO2 26 07/30/2018   Lab Results  Component Value Date   ALT 10 07/30/2018   AST 17 07/30/2018   ALKPHOS 101 07/30/2018   BILITOT 0.6 07/30/2018      RADIOGRAPHY: Dg Eye Foreign Body  Result Date: 08/12/2018 CLINICAL DATA:  Metal working/exposure; clearance prior to MRI EXAM: ORBITS FOR FOREIGN BODY - 2 VIEW COMPARISON:  None. FINDINGS: Water's views with eyes deviated superiorly and inferiorly were obtained. No intraorbital radiopaque foreign body. No fracture or dislocation. Paranasal sinuses are clear. IMPRESSION: No evidence of metallic foreign body within the orbits. Electronically Signed   By: Lowella Grip III M.D.   On: 08/12/2018 16:05   Ct Chest W Contrast  Result Date: 07/24/2018 CLINICAL DATA:  Smoker with upper back and chest pain for 2 months. 30 lb weight loss. Abnormal chest radiograph. No given history of malignancy. EXAM: CT CHEST WITH CONTRAST TECHNIQUE: Multidetector CT imaging of the chest was performed during intravenous contrast administration. CONTRAST:  30mL ISOVUE-300 IOPAMIDOL (ISOVUE-300) INJECTION 61% COMPARISON:  None. FINDINGS: Cardiovascular: There is atherosclerosis of the aorta, great vessels and coronary arteries. No acute vascular findings are demonstrated. There is suboptimal opacification of the pulmonary arteries. The central veins are patent. The heart size is normal. There is no pericardial effusion. Mediastinum/Nodes: There are multiple enlarged mediastinal and hilar lymph nodes. There is a high right paratracheal node measuring 2.0 x 1.3 cm on image 23/2. Peripherally enhancing subcarinal node measures 2.1 x 3.0 cm on image 73/2. There is a 3.0 x 1.8 cm left hilar node on image 73/2 and a 1.8 x 1.7 cm left axillary node on image 28/2. The thyroid gland, trachea and esophagus demonstrate no significant findings. Lungs/Pleura: There is no pleural effusion. There is a large left  upper lobe mass, measuring 5.6 x 3.6 cm on image 58/4. This is contiguous with the left hilum where there is probable adenopathy extending into the AP window. There is mild associated narrowing of the left upper lobe bronchus.  There are numerous other pulmonary nodules bilaterally (more than 10 in each lung), consistent with metastatic disease. The largest additional nodules include a 1.3 cm left lower lobe nodule (image 88/4), a 1.2 cm right upper lobe nodule (image 54/4) and a 1.2 cm right lower lobe nodule (image 87/4). There is mild underlying emphysema. There is possible mild postobstructive pneumonitis surrounding the dominant left upper lobe mass. Upper abdomen: 1.9 x 1.4 cm peripherally enhancing mass in the left hepatic lobe (image 125/2), consistent with metastatic disease. There are nonspecific bilateral adrenal nodules, measuring 19 x 15 mm on image 132/2 and 22 x 14 mm on image 129/2. There are mildly prominent lymph nodes in the gastrohepatic ligament. There is proximal gastric wall thickening versus incomplete distention. Musculoskeletal/Chest wall: There is diffuse sclerosis of the T1 vertebral body, suspicious for metastatic disease. No lytic lesion or pathologic fracture identified. IMPRESSION: 1. Dominant spiculated left upper lobe mass with multiple enlarged mediastinal, hilar and left axillary lymph nodes, multiple additional pulmonary nodules, a probable left hepatic metastasis and possible bilateral adrenal metastases. Findings are consistent with widespread lung cancer. Tissue sampling recommended. 2. Sclerosis of the T1 vertebral body, also suspicious for metastatic disease. 3. Aortic Atherosclerosis (ICD10-I70.0) and Emphysema (ICD10-J43.9). 4. These results will be called to the ordering clinician or representative by the Radiologist Assistant, and communication documented in the PACS or zVision Dashboard. Electronically Signed   By: Richardean Sale M.D.   On: 07/24/2018 14:26   Mr Jeri Cos DX Contrast  Result Date: 08/13/2018 CLINICAL DATA:  History of lung cancer. Headaches over the last 2 months. Assess for metastatic disease. EXAM: MRI HEAD WITHOUT AND WITH CONTRAST TECHNIQUE: Multiplanar, multiecho pulse sequences of the brain and surrounding structures were obtained without and with intravenous contrast. CONTRAST:  4.5 cc Gadovist COMPARISON:  No previous brain imaging. FINDINGS: Brain: There multiple brain metastases, with associated vasogenic edema. Edema is most pronounced in the right frontal and temporal lobe and present to a lesser extent in the left frontal lobe. There is mild mass effect with right-to-left shift of 1 or 2 mm. There is a good deal of motion artifact on this case and tiny metastases may be inapparent. In the cerebellum, there is a 6 mm metastasis in the right hemisphere image 30. There is a punctate metastasis in the right hemisphere image 37. There is a 4 mm metastasis in the right hemisphere image 39. There is probably a 4 mm metastasis in the vermis image 41. In the right hemisphere, there is a 17 mm metastasis within the temporal lobe image 35 with marked associated edema. There is a 10 mm right frontal metastasis image 52 with associated edema. There is a punctate metastasis in the right occipital lobe image 55. There is a 4 mm metastasis in the right parietal lobe image 56. There are 2 adjacent punctate metastases in the right frontal lobe image 64. There is a punctate metastasis in the right frontal lobe image 73. There is a 16 mm metastasis at the right frontoparietal vertex image 81. In the left hemisphere, there is a 5 mm metastasis at the left temporal tip image 35. There is a 9 mm metastasis in the lateral temporal lobe image 40. There is a 19 mm metastasis in the left frontal lobe image 55. There is a 4 mm metastasis in the left frontal lobe image 76. No hydrocephalus. No extra-axial collection. No bony metastatic disease identified. There is evidence of a  few old small cortical  infarctions, including in both parietal vertex regions, in the left frontal lobe and in the left occipital lobe. Vascular: Major vessels at the base of the brain show flow. Skull and upper cervical spine: Negative Sinuses/Orbits: Clear/normal Other: None IMPRESSION: Multiple brain metastases scattered throughout the cerebellum and both cerebral hemispheres. Vasogenic edema most pronounced in the right temporal lobe and right frontal lobe and to a lesser extent in the left frontal lobe. Mild mass effect with right-to-left shift of 1 or 2 mm. Total of 4 cerebellar metastases identified, the largest measuring 6 mm in the right hemisphere. Total of 8 metastases identified in the right cerebral hemisphere, the largest measuring 17 mm in the right temporal lobe. Total of 4 metastases identified in the left hemisphere, the largest measuring 19 mm in the left frontal lobe. There is considerable motion degradation, and small metastatic lesions may be inapparent. Few old scattered cortical infarctions. Electronically Signed   By: Nelson Chimes M.D.   On: 08/13/2018 07:23   Nm Pet Image Initial (pi) Skull Base To Thigh  Result Date: 08/13/2018 CLINICAL DATA:  Initial treatment strategy for left lung mass. EXAM: NUCLEAR MEDICINE PET SKULL BASE TO THIGH TECHNIQUE: 7.3 mCi F-18 FDG was injected intravenously. Full-ring PET imaging was performed from the skull base to thigh after the radiotracer. CT data was obtained and used for attenuation correction and anatomic localization. Fasting blood glucose: 66 mg/dl COMPARISON:  Chest CT on 07/24/2017 FINDINGS: (Background mediastinal blood pool activity: SUV max = 2.2) NECK: Hypermetabolic mass is seen on the left in level 2A measuring 2.9 x 1.7 cm, with SUV max of 10.0. 11 mm left level 2b lymph node on image 24/4 is hypermetabolic with SUV max of 6.2. A small hypermetabolic mass or lymph node is also seen in the left parotid gland on image 20/3 with SUV max  of 6.9. Sub-cm hypermetabolic lymph nodes are seen in the right neck at levels 2 B and 4. Asymmetric metabolic activity is seen in the right local cord without associated mass on CT, and this is consistent with paralysis of the left vocal cord. Incidental CT findings:  None. CHEST: Large hypermetabolic soft tissue mass in the left upper lobe measures 7.4 x 3.3 cm and shows direct mediastinal invasion in the aortopulmonary window. This has SUV max of 14.2, is consistent with primary lung carcinoma. Hypermetabolic lymphadenopathy is seen in throughout the mediastinum, bilateral hilar regions, and left axilla. Multiple hypermetabolic pulmonary nodules are seen bilaterally, consistent with bilateral pulmonary metastases. Incidental CT findings:  None. ABDOMEN/PELVIS: 2 cm hypermetabolic mass is seen in the anterior left hepatic lobe with SUV max of 4.8, consistent with liver metastasis. Bilateral hypermetabolic adrenal masses are consistent with bilateral adrenal metastasis. A small hypermetabolic lesion is seen in the tail pancreas, consistent with metastatic disease. A small hypermetabolic lesion is seen in the posterior wall of the upper stomach, consistent with metastatic disease. Mild hypermetabolic lymphadenopathy is seen adjacent to the GE junction and right retrocrural space, retroperitoneum in the paraaortic and left paraaortic spaces, and the central small bowel mesentery, consistent with metastatic disease. A small hypermetabolic lymph node is seen in the left inguinal region, suspicious for metastatic disease. Incidental CT findings:  None. SKELETON: Hypermetabolic bone lesions are seen in the cervical and upper thoracic spine, right scapula and left ilium, consistent with bone metastases. Incidental CT findings:  None. IMPRESSION: 7.4 cm hypermetabolic mass in the left upper lobe, consistent with primary lung carcinoma. Widespread stage IV metastatic disease involving  diffuse lymph nodes, both lungs, bones,  liver, bilateral adrenal glands, pancreatic tail, and upper stomach. Electronically Signed   By: Earle Gell M.D.   On: 08/13/2018 09:08       IMPRESSION/PLAN: 1. Probable metastatic stage IV lung cancer.  The patient has radiographic evidence of disease in the bones, liver, pancreas, adrenal glands, and brain.  He also has multifocal disease in the lungs.  We reviewed the rationale for completing his work-up which includes biopsy on Monday.  We will follow-up with the results once this has been performed.  We discussed the rationale for palliative radiotherapy to the whole brain, his chest, and his most symptomatic sites of the right scapula and C-spine.  We discussed the risks, benefits, short, and long term effects of radiotherapy, and the patient is interested in proceeding. Dr. Lisbeth Renshaw discusses the delivery and logistics of radiotherapy and anticipates a course of 2 weeks of radiotherapy. Written consent is obtained and placed in the chart, a copy was provided to the patient. He will be scheduled for simulation. 2. Drug abuse and tobacco cessation.  The patient was counseled on the rationale for meeting with social work regarding these issues, as well as long-term planning.  I am concerned for his ability to care for himself long-term. 3. Goals of care.  Given the patient's medical diagnosis and concerns for treatment related compliance as well as risks associated with pulmonary compromise, we discussed goals of care.  The patient wishes to pursue active therapy.  We would like to involve the palliative care team.  While I asked he and his family to consider options of how he would want to be cared for if he had a cardiac event, he initially states do everything to keep me alive, but after further discussing their family's experience with dealing with the death of another family member who would been on life support, he is leaning towards making a decision for DNR.  This was not formally documented but  will be revisited upon further discussion in our clinic.  In a visit lasting 60 minutes, greater than 50% of the time was spent face to face discussing his case, and coordinating the patient's care.   The above documentation reflects my direct findings during this shared patient visit. Please see the separate note by Dr. Lisbeth Renshaw on this date for the remainder of the patient's plan of care.    Carola Rhine, PAC

## 2018-08-20 NOTE — Progress Notes (Signed)
[place] Psychosocial Distress Screening Clinical Social Work  Clinical Social Work was referred by distress screening protocol.  The patient scored a 10 on the Psychosocial Distress Thermometer which indicates severe distress. Clinical Social Worker contacted patient by phone to assess for distress and other psychosocial needs. Unable to reach patient or sister by phone, left VM w contact information at both [facility] and [facility] on sister's VM.  Noted that patient has appt at [facility] today.  Encouraged to call back at either location.  ONCBCN DISTRESS SCREENING 08/20/2018  Screening Type Initial Screening  Distress experienced in past week (1-10) 10  Physical Problem type Pain;Sleep/insomnia;Getting around;Breathing;Loss of appetitie;Constipation/diarrhea;Tingling hands/feet;Skin dry/itchy  Other Sister Hilda Blades: [phone]    Clinical Social Worker follow up needed: Yes.    If yes, follow up plan:  Await return call.   Beverely Pace, [place], LCSW Clinical Social Worker Phone:  [phone]

## 2018-08-21 ENCOUNTER — Encounter: Payer: Self-pay | Admitting: General Practice

## 2018-08-21 NOTE — Progress Notes (Signed)
[facility] CSW Progress Notes  Follow up w sister, Hilda Blades, re patient's needs.  Patient lived alone but after diagnosis, he has moved w sister and husband in [place] who are willing to provide care for him.  Home is workable for mobility issues, sister reports no current needs to help w caregiving.  She and husband are willing to transport patient to [facility] for radiation appointments, planning for rest of treatment is pending results of biopsy.  Plans to follow up w [facility] when possible.  Encouraged sister to call CSW as needed for help, but to direct urgent needs to the [place].  Will plan to introduce self to patient and sister during CT Sim appt on 10/11.  Edwyna Shell, LCSW Clinical Social Worker Phone:  [phone]

## 2018-08-22 ENCOUNTER — Other Ambulatory Visit: Payer: Self-pay | Admitting: Radiology

## 2018-08-23 ENCOUNTER — Ambulatory Visit
Admission: RE | Admit: 2018-08-23 | Discharge: 2018-08-23 | Disposition: A | Discharge status: Home / Self Care | Payer: Medicare HMO | Source: Ambulatory Visit | Attending: Radiation Oncology | Admitting: Radiation Oncology

## 2018-08-23 ENCOUNTER — Other Ambulatory Visit (HOSPITAL_COMMUNITY): Payer: Self-pay | Admitting: Internal Medicine

## 2018-08-23 DIAGNOSIS — C3412 Malignant neoplasm of upper lobe, left bronchus or lung: Secondary | ICD-10-CM | POA: Diagnosis not present

## 2018-08-23 DIAGNOSIS — Z510 Encounter for antineoplastic radiation therapy: Secondary | ICD-10-CM | POA: Insufficient documentation

## 2018-08-23 DIAGNOSIS — C3490 Malignant neoplasm of unspecified part of unspecified bronchus or lung: Secondary | ICD-10-CM

## 2018-08-23 DIAGNOSIS — C7931 Secondary malignant neoplasm of brain: Secondary | ICD-10-CM | POA: Diagnosis not present

## 2018-08-23 MED ORDER — OXYCODONE-ACETAMINOPHEN 5-325 MG PO TABS
1.0000 | ORAL_TABLET | Freq: Once | ORAL | Status: AC
  Administered 2018-08-23: 1 via ORAL
  Filled 2018-08-23: qty 1

## 2018-08-26 ENCOUNTER — Ambulatory Visit (HOSPITAL_COMMUNITY)
Admission: RE | Admit: 2018-08-26 | Discharge: 2018-08-26 | Disposition: A | Discharge status: Home / Self Care | Payer: Medicare HMO | Source: Ambulatory Visit | Attending: Internal Medicine | Admitting: Internal Medicine

## 2018-08-26 ENCOUNTER — Encounter (HOSPITAL_COMMUNITY): Payer: Self-pay

## 2018-08-26 ENCOUNTER — Other Ambulatory Visit: Payer: Self-pay

## 2018-08-26 DIAGNOSIS — C7951 Secondary malignant neoplasm of bone: Secondary | ICD-10-CM | POA: Diagnosis not present

## 2018-08-26 DIAGNOSIS — F17200 Nicotine dependence, unspecified, uncomplicated: Secondary | ICD-10-CM | POA: Diagnosis not present

## 2018-08-26 DIAGNOSIS — C773 Secondary and unspecified malignant neoplasm of axilla and upper limb lymph nodes: Secondary | ICD-10-CM | POA: Insufficient documentation

## 2018-08-26 DIAGNOSIS — R9389 Abnormal findings on diagnostic imaging of other specified body structures: Secondary | ICD-10-CM

## 2018-08-26 DIAGNOSIS — Z7902 Long term (current) use of antithrombotics/antiplatelets: Secondary | ICD-10-CM | POA: Insufficient documentation

## 2018-08-26 DIAGNOSIS — Z79899 Other long term (current) drug therapy: Secondary | ICD-10-CM | POA: Diagnosis not present

## 2018-08-26 DIAGNOSIS — C787 Secondary malignant neoplasm of liver and intrahepatic bile duct: Secondary | ICD-10-CM | POA: Insufficient documentation

## 2018-08-26 DIAGNOSIS — N400 Enlarged prostate without lower urinary tract symptoms: Secondary | ICD-10-CM | POA: Diagnosis not present

## 2018-08-26 DIAGNOSIS — I10 Essential (primary) hypertension: Secondary | ICD-10-CM | POA: Diagnosis not present

## 2018-08-26 DIAGNOSIS — F17210 Nicotine dependence, cigarettes, uncomplicated: Secondary | ICD-10-CM | POA: Insufficient documentation

## 2018-08-26 LAB — CBC WITH DIFFERENTIAL/PLATELET
ABS IMMATURE GRANULOCYTES: 0.21 10*3/uL — AB (ref 0.00–0.07)
BASOS PCT: 0 %
Basophils Absolute: 0 10*3/uL (ref 0.0–0.1)
EOS ABS: 0 10*3/uL (ref 0.0–0.5)
EOS PCT: 0 %
HCT: 41.3 % (ref 39.0–52.0)
HEMOGLOBIN: 13.7 g/dL (ref 13.0–17.0)
IMMATURE GRANULOCYTES: 1 %
Lymphocytes Relative: 7 %
Lymphs Abs: 1.4 10*3/uL (ref 0.7–4.0)
MCH: 30.9 pg (ref 26.0–34.0)
MCHC: 33.2 g/dL (ref 30.0–36.0)
MCV: 93 fL (ref 80.0–100.0)
Monocytes Absolute: 1.9 10*3/uL — ABNORMAL HIGH (ref 0.1–1.0)
Monocytes Relative: 9 %
Neutro Abs: 17.3 10*3/uL — ABNORMAL HIGH (ref 1.7–7.7)
Neutrophils Relative %: 83 %
Platelets: 452 10*3/uL — ABNORMAL HIGH (ref 150–400)
RBC: 4.44 MIL/uL (ref 4.22–5.81)
RDW: 14.2 % (ref 11.5–15.5)
WBC: 20.9 10*3/uL — AB (ref 4.0–10.5)
nRBC: 0 % (ref 0.0–0.2)

## 2018-08-26 LAB — BASIC METABOLIC PANEL
Anion gap: 13 (ref 5–15)
BUN: 16 mg/dL (ref 8–23)
CALCIUM: 9.2 mg/dL (ref 8.9–10.3)
CO2: 27 mmol/L (ref 22–32)
CREATININE: 0.62 mg/dL (ref 0.61–1.24)
Chloride: 92 mmol/L — ABNORMAL LOW (ref 98–111)
GFR calc Af Amer: >60 mL/min (ref >60)
Glucose, Bld: 88 mg/dL (ref 70–99)
POTASSIUM: 3.6 mmol/L (ref 3.5–5.1)
SODIUM: 132 mmol/L — AB (ref 135–145)

## 2018-08-26 LAB — PROTIME-INR
INR: 0.83
PROTHROMBIN TIME: 11.3 s — AB (ref 11.4–15.2)

## 2018-08-26 MED ORDER — LIDOCAINE HCL 1 % IJ SOLN
INTRAMUSCULAR | Status: AC
  Filled 2018-08-26: qty 20

## 2018-08-26 MED ORDER — MIDAZOLAM HCL 2 MG/2ML IJ SOLN
INTRAMUSCULAR | Status: DC | PRN
  Administered 2018-08-26: 0.5 mg via INTRAVENOUS
  Administered 2018-08-26: 1 mg via INTRAVENOUS

## 2018-08-26 MED ORDER — MIDAZOLAM HCL 2 MG/2ML IJ SOLN
INTRAMUSCULAR | Status: AC
  Filled 2018-08-26: qty 2

## 2018-08-26 MED ORDER — FENTANYL CITRATE (PF) 100 MCG/2ML IJ SOLN
INTRAMUSCULAR | Status: DC | PRN
  Administered 2018-08-26: 50 ug via INTRAVENOUS
  Administered 2018-08-26: 25 ug via INTRAVENOUS

## 2018-08-26 MED ORDER — SODIUM CHLORIDE 0.9 % IV SOLN
INTRAVENOUS | Status: DC
  Administered 2018-08-26: 11:00:00 via INTRAVENOUS

## 2018-08-26 MED ORDER — FENTANYL CITRATE (PF) 100 MCG/2ML IJ SOLN
INTRAMUSCULAR | Status: AC
  Filled 2018-08-26: qty 2

## 2018-08-26 NOTE — Consult Note (Signed)
Chief Complaint: Patient was seen in consultation today for image guided left axillary lymph node biopsy  Referring Physician(s): Higgs,Vetta  Supervising Physician: Aletta Edouard  Patient Status: [facility] - Out-pt  History of Present Illness: Gregory Hickman is a 67 y.o. male smoker with history of neck/back/chest discomfort, weight loss, occasional dizziness and recent imaging revealing a 7.4 cm hypermetabolic mass in the left upper lobe lung along with widespread stage IV metastatic disease involving diffuse lymph nodes, brain, both lungs, bones, liver, bilateral adrenal glands, pancreatic tail and upper stomach.  He has no known history of cancer and presents today for left axillary lymph node biopsy for further evaluation.  Past Medical History:  Diagnosis Date  . BPH (benign prostatic hyperplasia)   . Fracture   . Hypertension     Past Surgical History:  Procedure Laterality Date  . APPENDECTOMY    . EYE SURGERY  1975  . HAND SURGERY    . HERNIA REPAIR  2015    Allergies: Patient has no known allergies.  Medications: Prior to Admission medications   Medication Sig Start Date End Date Taking? Authorizing Provider  amLODipine (NORVASC) 10 MG tablet Take 1 tablet by mouth daily.   Yes [provider]  atorvastatin (LIPITOR) 40 MG tablet Take 1 tablet by mouth at bedtime.    Yes [provider]  dexamethasone (DECADRON) 4 MG tablet Take 1 tablet (4 mg total) by mouth 2 (two) times daily. 08/14/18  Yes Lockamy, Randi L, NP-C  dutasteride (AVODART) 0.5 MG capsule Take 1 capsule by mouth daily.   Yes [provider]  ferrous sulfate 325 (65 FE) MG tablet Take 325 mg by mouth 2 (two) times daily with a meal.   Yes [provider]  lisinopril-hydrochlorothiazide (PRINZIDE,ZESTORETIC) 20-12.5 MG tablet Take 1 tablet by mouth 2 (two) times daily.    Yes [provider]  morphine (MS CONTIN) 30 MG 12 hr tablet TAKE 1 TABLET BY MOUTH  EVERY 12 HOURS 08/23/18  Yes Higgs, Mathis Dad, MD  morphine (MSIR) 15 MG tablet TAKE  (1)  TABLET  EVERY FOUR HOURS AS NEEDED FOR SEVERE PAIN.    - MAY MAKE DROWSY - 08/23/18  Yes Higgs, Mathis Dad, MD  nabumetone (RELAFEN) 750 MG tablet Take 1 tablet by mouth 2 (two) times daily.   Yes [provider]  omeprazole (PRILOSEC) 20 MG capsule Take 1 capsule by mouth daily.   Yes [provider]  tamsulosin (FLOMAX) 0.4 MG CAPS capsule Take 1 tablet by mouth daily.   Yes [provider]  clopidogrel (PLAVIX) 75 MG tablet Take 1 tablet by mouth daily.    [provider]     Family History  Problem Relation Age of Onset  . Colon cancer Neg Hx     Social History   Socioeconomic History  . Marital status: Divorced    Spouse name: Not on file  . Number of children: Not on file  . Years of education: Not on file  . Highest education level: Not on file  Occupational History  . Not on file  Social Needs  . Financial resource strain: Not on file  . Food insecurity:    Worry: Not on file    Inability: Not on file  . Transportation needs:    Medical: No    Non-medical: No  Tobacco Use  . Smoking status: Current Every Day Smoker    Packs/day: 0.50    Types: Cigarettes  . Smokeless tobacco: Former  User    Types: Chew  . Tobacco comment: one pack of cigs last 3 days  Substance and Sexual Activity  . Alcohol use: Yes    Comment: As of 07/30/18 (1) 40 ounce beer daily  . Drug use: Yes    Types: Marijuana, "Crack" cocaine    Comment: 1-2 times per month; marijuana, cocaine when he can afford it  . Sexual activity: Not Currently  Lifestyle  . Physical activity:    Days per week: Not on file    Minutes per session: Not on file  . Stress: Not on file  Relationships  . Social connections:    Talks on phone: Not on file    Gets together: Not on file    Attends religious service: Not on file    Active member of club or organization: Not on file    Attends  meetings of clubs or organizations: Not on file    Relationship status: Not on file  Other Topics Concern  . Not on file  Social History Narrative  . Not on file      Review of Systems see above; currently denies fever, headache, abdominal pain, nausea, vomiting or bleeding.  Vital Signs: Blood pressure 125/67, temp 98.3, heart rate 60, respirations 20, O2 sat 96% room air   Physical Exam awake, answering questions appropriately.  Cachectic appearing male; left lateral eye gaze noted but not fixed; chest with distant breath sounds bilaterally, few right basilar crackles.  Heart with normal rate, occasional ectopy.  Abdomen soft, positive bowel sounds, nontender.  No lower extremity edema.  Palpable adenopathy head /neck region.  Imaging: Dg Eye Foreign Body  Result Date: 08/12/2018 CLINICAL DATA:  Metal working/exposure; clearance prior to MRI EXAM: ORBITS FOR FOREIGN BODY - 2 VIEW COMPARISON:  None. FINDINGS: Water's views with eyes deviated superiorly and inferiorly were obtained. No intraorbital radiopaque foreign body. No fracture or dislocation. Paranasal sinuses are clear. IMPRESSION: No evidence of metallic foreign body within the orbits. Electronically Signed   By: Lowella Grip III M.D.   On: 08/12/2018 16:05   Mr Jeri Cos OQ Contrast  Result Date: 08/13/2018 CLINICAL DATA:  History of lung cancer. Headaches over the last 2 months. Assess for metastatic disease. EXAM: MRI HEAD WITHOUT AND WITH CONTRAST TECHNIQUE: Multiplanar, multiecho pulse sequences of the brain and surrounding structures were obtained without and with intravenous contrast. CONTRAST:  4.5 cc Gadovist COMPARISON:  No previous brain imaging. FINDINGS: Brain: There multiple brain metastases, with associated vasogenic edema. Edema is most pronounced in the right frontal and temporal lobe and present to a lesser extent in the left frontal lobe. There is mild mass effect with right-to-left shift of 1 or 2 mm. There is  a good deal of motion artifact on this case and tiny metastases may be inapparent. In the cerebellum, there is a 6 mm metastasis in the right hemisphere image 30. There is a punctate metastasis in the right hemisphere image 37. There is a 4 mm metastasis in the right hemisphere image 39. There is probably a 4 mm metastasis in the vermis image 41. In the right hemisphere, there is a 17 mm metastasis within the temporal lobe image 35 with marked associated edema. There is a 10 mm right frontal metastasis image 52 with associated edema. There is a punctate metastasis in the right occipital lobe image 55. There is a 4 mm metastasis in the right parietal lobe image 56. There are 2 adjacent punctate metastases in the  right frontal lobe image 64. There is a punctate metastasis in the right frontal lobe image 73. There is a 16 mm metastasis at the right frontoparietal vertex image 81. In the left hemisphere, there is a 5 mm metastasis at the left temporal tip image 35. There is a 9 mm metastasis in the lateral temporal lobe image 40. There is a 19 mm metastasis in the left frontal lobe image 55. There is a 4 mm metastasis in the left frontal lobe image 76. No hydrocephalus. No extra-axial collection. No bony metastatic disease identified. There is evidence of a few old small cortical infarctions, including in both parietal vertex regions, in the left frontal lobe and in the left occipital lobe. Vascular: Major vessels at the base of the brain show flow. Skull and upper cervical spine: Negative Sinuses/Orbits: Clear/normal Other: None IMPRESSION: Multiple brain metastases scattered throughout the cerebellum and both cerebral hemispheres. Vasogenic edema most pronounced in the right temporal lobe and right frontal lobe and to a lesser extent in the left frontal lobe. Mild mass effect with right-to-left shift of 1 or 2 mm. Total of 4 cerebellar metastases identified, the largest measuring 6 mm in the right hemisphere. Total of  8 metastases identified in the right cerebral hemisphere, the largest measuring 17 mm in the right temporal lobe. Total of 4 metastases identified in the left hemisphere, the largest measuring 19 mm in the left frontal lobe. There is considerable motion degradation, and small metastatic lesions may be inapparent. Few old scattered cortical infarctions. Electronically Signed   By: Nelson Chimes M.D.   On: 08/13/2018 07:23   Nm Pet Image Initial (pi) Skull Base To Thigh  Result Date: 08/13/2018 CLINICAL DATA:  Initial treatment strategy for left lung mass. EXAM: NUCLEAR MEDICINE PET SKULL BASE TO THIGH TECHNIQUE: 7.3 mCi F-18 FDG was injected intravenously. Full-ring PET imaging was performed from the skull base to thigh after the radiotracer. CT data was obtained and used for attenuation correction and anatomic localization. Fasting blood glucose: 66 mg/dl COMPARISON:  Chest CT on 07/24/2017 FINDINGS: (Background mediastinal blood pool activity: SUV max = 2.2) NECK: Hypermetabolic mass is seen on the left in level 2A measuring 2.9 x 1.7 cm, with SUV max of 10.0. 11 mm left level 2b lymph node on image 62/8 is hypermetabolic with SUV max of 6.2. A small hypermetabolic mass or lymph node is also seen in the left parotid gland on image 20/3 with SUV max of 6.9. Sub-cm hypermetabolic lymph nodes are seen in the right neck at levels 2 B and 4. Asymmetric metabolic activity is seen in the right local cord without associated mass on CT, and this is consistent with paralysis of the left vocal cord. Incidental CT findings:  None. CHEST: Large hypermetabolic soft tissue mass in the left upper lobe measures 7.4 x 3.3 cm and shows direct mediastinal invasion in the aortopulmonary window. This has SUV max of 14.2, is consistent with primary lung carcinoma. Hypermetabolic lymphadenopathy is seen in throughout the mediastinum, bilateral hilar regions, and left axilla. Multiple hypermetabolic pulmonary nodules are seen  bilaterally, consistent with bilateral pulmonary metastases. Incidental CT findings:  None. ABDOMEN/PELVIS: 2 cm hypermetabolic mass is seen in the anterior left hepatic lobe with SUV max of 4.8, consistent with liver metastasis. Bilateral hypermetabolic adrenal masses are consistent with bilateral adrenal metastasis. A small hypermetabolic lesion is seen in the tail pancreas, consistent with metastatic disease. A small hypermetabolic lesion is seen in the posterior wall of the upper  stomach, consistent with metastatic disease. Mild hypermetabolic lymphadenopathy is seen adjacent to the GE junction and right retrocrural space, retroperitoneum in the paraaortic and left paraaortic spaces, and the central small bowel mesentery, consistent with metastatic disease. A small hypermetabolic lymph node is seen in the left inguinal region, suspicious for metastatic disease. Incidental CT findings:  None. SKELETON: Hypermetabolic bone lesions are seen in the cervical and upper thoracic spine, right scapula and left ilium, consistent with bone metastases. Incidental CT findings:  None. IMPRESSION: 7.4 cm hypermetabolic mass in the left upper lobe, consistent with primary lung carcinoma. 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 Electronically Signed   By: Earle Gell M.D.   On: 08/13/2018 09:08    Labs:  CBC: Recent Labs    04/09/18 1336 07/30/18 1241 08/26/18 1104  WBC 12.1* 11.3* 20.9*  HGB 14.8 13.1 13.7  HCT 42.3 38.3* 41.3  PLT 364 333 452*    COAGS: Recent Labs    08/26/18 1145  INR 0.83    BMP: Recent Labs    07/24/18 0850 07/30/18 1241 08/26/18 1104  NA  --  131* 132*  K  --  3.2* 3.6  CL  --  94* 92*  CO2  --  26 27  GLUCOSE  --  78 88  BUN  --  10 16  CALCIUM  --  9.5 9.2  CREATININE 1.00 0.73 0.62  GFRNONAA  --  >60 >60  GFRAA  --  >60 >60    LIVER FUNCTION  TESTS: Recent Labs    07/30/18 1241  BILITOT 0.6  AST 17  ALT 10  ALKPHOS 101  PROT 7.7  ALBUMIN 3.6    TUMOR MARKERS: No results for input(s): AFPTM, CEA, CA199, CHROMGRNA in the last 8760 hours.  Assessment and Plan: 67 y.o. male smoker with history of neck/back/chest discomfort, weight loss, occasional dizziness and recent imaging revealing a 7.4 cm hypermetabolic mass in the left upper lobe lung along with widespread stage IV metastatic disease involving diffuse lymph nodes, brain, both lungs, bones, liver, bilateral adrenal glands, pancreatic tail and upper stomach.  He has no known history of cancer and presents today for left axillary lymph node biopsy for further evaluation.Risks and benefits discussed with the patient/family including, but not limited to bleeding, infection, damage to adjacent structures or low yield requiring additional tests.  All of the patient's questions were answered, patient is agreeable to proceed. Consent signed and in chart.     Thank you for this interesting consult.  I greatly enjoyed meeting Gregory Hickman and look forward to participating in their care.  A copy of this report was sent to the requesting provider on this date.  Electronically Signed: D. Rowe Robert, PA-C 08/26/2018, 12:12 PM   I spent a total of 20 minutes  in face to face in clinical consultation, greater than 50% of which was counseling/coordinating care for image guided left axillary lymph node biopsy

## 2018-08-26 NOTE — Discharge Instructions (Signed)
Moderate Conscious Sedation, Adult, Care After These instructions provide you with information about caring for yourself after your procedure. Your health care provider may also give you more specific instructions. Your treatment has been planned according to current medical practices, but problems sometimes occur. Call your health care provider if you have any problems or questions after your procedure. What can I expect after the procedure? After your procedure, it is common:  To feel sleepy for several hours.  To feel clumsy and have poor balance for several hours.  To have poor judgment for several hours.  To vomit if you eat too soon.  Follow these instructions at home: For at least 24 hours after the procedure:   Do not: ? Participate in activities where you could fall or become injured. ? Drive. ? Use heavy machinery. ? Drink alcohol. ? Take sleeping pills or medicines that cause drowsiness. ? Make important decisions or sign legal documents. ? Take care of children on your own.  Rest. Eating and drinking  Follow the diet recommended by your health care provider.  If you vomit: ? Drink water, juice, or soup when you can drink without vomiting. ? Make sure you have little or no nausea before eating solid foods. General instructions  Have a responsible adult stay with you until you are awake and alert.  Take over-the-counter and prescription medicines only as told by your health care provider.  If you smoke, do not smoke without supervision.  Keep all follow-up visits as told by your health care provider. This is important. Contact a health care provider if:  You keep feeling nauseous or you keep vomiting.  You feel light-headed.  You develop a rash.  You have a fever. Get help right away if:  You have trouble breathing. This information is not intended to replace advice given to you by your health care provider. Make sure you discuss any questions you have  with your health care provider. Document Released: 08/20/2013 Document Revised: 04/03/2016 Document Reviewed: 02/19/2016 Elsevier Interactive Patient Education  2018 [employer].    Needle Biopsy, Care After These instructions give you information about caring for yourself after your procedure. Your doctor may also give you more specific instructions. Call your doctor if you have any problems or questions after your procedure. Follow these instructions at home:  Rest as told by your doctor.  Take medicines only as told by your doctor.  There are many different ways to close and cover the biopsy site, including stitches (sutures), skin glue, and adhesive strips. Follow instructions from your doctor about: ? How to take care of your biopsy site. ? When and how you should change your bandage (dressing). Keep site clean and dry. May remove dressing in 4 hours and shower, replace bandaid as necessary. ? When you should remove your dressing. ? Removing whatever was used to close your biopsy site.  Check your biopsy site every day for signs of infection. Watch for: ? Redness, swelling, or pain. ? Fluid, blood, or pus. Contact a doctor if:  You have a fever.  You have redness, swelling, or pain at the biopsy site, and it lasts longer than a few days.  You have fluid, blood, or pus coming from the biopsy site.  You feel sick to your stomach (nauseous).  You throw up (vomit). Get help right away if:  You are short of breath.  You have trouble breathing.  Your chest hurts.  You feel dizzy or you pass out (faint).  You have bleeding that does not stop with pressure or a bandage.  You cough up blood.  Your belly (abdomen) hurts. This information is not intended to replace advice given to you by your health care provider. Make sure you discuss any questions you have with your health care provider. Document Released: 10/12/2008 Document Revised: 04/06/2016 Document Reviewed:  1Nov 03, 202015 Elsevier Interactive Patient Education  [employer].

## 2018-08-26 NOTE — Procedures (Signed)
Interventional Radiology Procedure Note  Procedure: US guided core biopsy of left subpectoral lymph node  Complications: None  Estimated Blood Loss: < 10 mL  Findings: 18 G core biopsy x 5 of 1.7 cm left subpectoral lymph node.  Venetia Night. Kathlene Cote, M.D Pager:  [phone]

## 2018-08-28 ENCOUNTER — Ambulatory Visit
Admission: RE | Admit: 2018-08-28 | Discharge: 2018-08-28 | Disposition: A | Discharge status: Home / Self Care | Payer: Medicare HMO | Source: Ambulatory Visit | Attending: Radiation Oncology | Admitting: Radiation Oncology

## 2018-08-28 ENCOUNTER — Telehealth: Payer: Self-pay | Admitting: Radiation Oncology

## 2018-08-28 ENCOUNTER — Encounter: Payer: Self-pay | Admitting: Radiation Oncology

## 2018-08-28 DIAGNOSIS — C3412 Malignant neoplasm of upper lobe, left bronchus or lung: Secondary | ICD-10-CM | POA: Insufficient documentation

## 2018-08-28 DIAGNOSIS — Z510 Encounter for antineoplastic radiation therapy: Secondary | ICD-10-CM | POA: Diagnosis not present

## 2018-08-28 NOTE — Telephone Encounter (Signed)
Attempted to call sister to review pathology, confirmed lung cancer

## 2018-08-29 ENCOUNTER — Ambulatory Visit
Admission: RE | Admit: 2018-08-29 | Discharge: 2018-08-29 | Disposition: A | Discharge status: Home / Self Care | Payer: Medicare HMO | Source: Ambulatory Visit | Attending: Radiation Oncology | Admitting: Radiation Oncology

## 2018-08-29 ENCOUNTER — Encounter (HOSPITAL_COMMUNITY): Payer: Self-pay | Admitting: *Deleted

## 2018-08-29 DIAGNOSIS — Z510 Encounter for antineoplastic radiation therapy: Secondary | ICD-10-CM | POA: Diagnosis not present

## 2018-08-29 NOTE — Progress Notes (Signed)
I spoke with [facility] in pathology. Foundation one and PDL-1 sent 08/28/18 on Accession # [id number].

## 2018-08-30 ENCOUNTER — Encounter (HOSPITAL_COMMUNITY): Payer: Self-pay | Admitting: Internal Medicine

## 2018-08-30 ENCOUNTER — Inpatient Hospital Stay (HOSPITAL_BASED_OUTPATIENT_CLINIC_OR_DEPARTMENT_OTHER): Payer: Medicare HMO | Admitting: Internal Medicine

## 2018-08-30 ENCOUNTER — Other Ambulatory Visit (HOSPITAL_COMMUNITY): Payer: Self-pay | Admitting: *Deleted

## 2018-08-30 ENCOUNTER — Other Ambulatory Visit: Payer: Self-pay

## 2018-08-30 ENCOUNTER — Ambulatory Visit
Admission: RE | Admit: 2018-08-30 | Discharge: 2018-08-30 | Disposition: A | Discharge status: Home / Self Care | Payer: Medicare HMO | Source: Ambulatory Visit | Attending: Radiation Oncology | Admitting: Radiation Oncology

## 2018-08-30 VITALS — BP 119/57 | HR 60 | Temp 97.5°F | Resp 18 | Wt 107.2 lb

## 2018-08-30 DIAGNOSIS — R9389 Abnormal findings on diagnostic imaging of other specified body structures: Secondary | ICD-10-CM | POA: Diagnosis not present

## 2018-08-30 DIAGNOSIS — Z510 Encounter for antineoplastic radiation therapy: Secondary | ICD-10-CM | POA: Diagnosis not present

## 2018-08-30 DIAGNOSIS — C3412 Malignant neoplasm of upper lobe, left bronchus or lung: Secondary | ICD-10-CM | POA: Diagnosis not present

## 2018-08-30 DIAGNOSIS — I10 Essential (primary) hypertension: Secondary | ICD-10-CM | POA: Diagnosis not present

## 2018-08-30 DIAGNOSIS — C3492 Malignant neoplasm of unspecified part of left bronchus or lung: Secondary | ICD-10-CM

## 2018-08-30 DIAGNOSIS — F1290 Cannabis use, unspecified, uncomplicated: Secondary | ICD-10-CM

## 2018-08-30 DIAGNOSIS — C7951 Secondary malignant neoplasm of bone: Secondary | ICD-10-CM

## 2018-08-30 DIAGNOSIS — Z720 Tobacco use: Secondary | ICD-10-CM

## 2018-08-30 DIAGNOSIS — Z7289 Other problems related to lifestyle: Secondary | ICD-10-CM

## 2018-08-30 DIAGNOSIS — C7931 Secondary malignant neoplasm of brain: Secondary | ICD-10-CM | POA: Diagnosis not present

## 2018-08-30 MED ORDER — FOLIC ACID 1 MG PO TABS: 1.0000 mg | ORAL_TABLET | Freq: Every day | ORAL | 5 refills | Status: AC

## 2018-08-30 MED ORDER — MORPHINE SULFATE ER 30 MG PO TBCR: 30.0000 mg | EXTENDED_RELEASE_TABLET | Freq: Two times a day (BID) | ORAL | 0 refills | Status: DC

## 2018-08-30 NOTE — Progress Notes (Signed)
Diagnosis Non-small cell cancer of left lung ([place]) - Plan: CBC with Differential/Platelet, Comprehensive metabolic panel, Lactate dehydrogenase  Staging Cancer Staging No matching staging information was found for the patient.  Assessment and Plan:  1.  Stage IV NSCLC>   Patient was complaining of several month history of back pain.  He had a CT of the chest done 07/24/2018 that showed IMPRESSION: 1. Dominant spiculated left upper lobe mass with multiple enlarged mediastinal, hilar and left axillary lymph nodes, multiple additional pulmonary nodules, a probable left hepatic metastasis and possible bilateral adrenal metastases. Findings are consistent with widespread lung cancer. Tissue sampling recommended. 2. Sclerosis of the T1 vertebral body, also suspicious for metastatic disease. 3. Aortic Atherosclerosis (ICD10-I70.0) and Emphysema (ICD10-J43.9). 4. These results will be called to the ordering clinician or representative by the Radiologist Assistant, and communication documented in the PACS or zVision Dashboard.  He has a long smoking history.  He also reports alcohol use and marijuana.    Pet scan done 08/12/2018 reviewed and showed IMPRESSION: 7.4 cm hypermetabolic mass in the left upper lobe, consistent with primary lung carcinoma.  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  MRI of brain done 08/12/2018 reviewed and showed  Impression:   Multiple brain metastases scattered throughout the cerebellum and both cerebral hemispheres. Vasogenic edema most pronounced in the right temporal lobe and right frontal lobe and to a lesser extent in the left frontal lobe. Mild mass effect with right-to-left shift of 1 or 2 mm.  Total of 4 cerebellar metastases identified, the largest measuring 6 mm in the right hemisphere. Total of 8 metastases identified in  the right cerebral hemisphere, the largest measuring 17 mm in the right temporal lobe. Total of 4 metastases identified in the left hemisphere, the largest measuring 19 mm in the left frontal lobe.  There is considerable motion degradation, and small metastatic lesions may be inapparent.  Few old scattered cortical infarctions.  Pt had biopsy of left pectoral LN on 08/26/2018 and pathology returned as Adenocarcinoma.  Foundation testing and lung biomarkers requested.    I discussed with pt he has advanced NSCLC.  Pt presented with extensive disease with brain mets.  He is currently undergoing RT and has 7 RT remaining.   Long talk held with pt had family today regarding standard therapy for advanced nonsquamous NSCLC based on results of Keynote trial which showed increased overall survival and increased progression free survival with Pembro dosed at 200 mg IV every 3 weeks with Carboplatin AUC 5 every 3 weeks with Alimta 500 mg/m2 every 3 weeks for 4 cycles with Alimta and Pembro maintenance.   Side effects of the medications were reviewed with the patient and he was provided written information.   Pt will be set up for chemotherapy teaching.  He will also be set up for [facility] placement.  He should continue RT as recommended.  Pt will receive B12 today and every 9 weeks as therapy proceeds.  He will also begin folic acid.   Pt will RTC in 7-10 days for follow-up prior to C1.  All questions answered and patient expressed understanding of the information presented.  2.   Brain mets.  MRI of brain that showed findings concerning of brain mets.  He is on Decadron.  He should continue RT as directed.  He feels symptoms have improved.   3.  Pain.  Pt feels pain is improved  with current regimen.  He should continue MS contin 30 mg po bid and MSIR 15 mg q 4 hrs prn for breakthrough.  Family support recommended to continue.    4.  HTN.  BP 119/57.   Follow-up with PCP.    5.  Bone mets.  Pt  recommended for Xgeva for bone strengthening every 3-4 weeks due to bone lesions noted on imaging.    25 minutes spent with more than 50% spent in counseling and coordination of care.    Interval history:  Historical data obtained from the note dated 07/30/2018:  67 year old male referred for evaluation due to abnormal scan.  He was complaining of upper back pain for several months.  He also reported weight loss, dizziness.  Patient had a CT of the chest done 07/24/2018 that showed IMPRESSION: 1. Dominant spiculated left upper lobe mass with multiple enlarged mediastinal, hilar and left axillary lymph nodes, multiple additional pulmonary nodules, a probable left hepatic metastasis and possible bilateral adrenal metastases. Findings are consistent with widespread lung cancer. Tissue sampling recommended. 2. Sclerosis of the T1 vertebral body, also suspicious for metastatic disease. 3. Aortic Atherosclerosis (ICD10-I70.0) and Emphysema (ICD10-J43.9). 4. These results will be called to the ordering clinician or representative by the Radiologist Assistant, and communication documented in the PACS or zVision Dashboard.  He has a long smoking history.  He also reports alcohol use in marijuana.    Current Status:  Pt is seen today for follow-up to go recent biopsy.  He reports pain is controlled with current regimen.    Problem List Patient Active Problem List   Diagnosis Date Noted  . 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 lung ([place]) [C34.12] 08/28/2018  . Anemia [D64.9] 04/09/2018  . Dark stools [R19.5] 04/09/2018  . RECTAL BLEEDING [K62.5] 05/31/2010  . ANOREXIA [R63.0] 05/31/2010  . WEIGHT LOSS, RECENT [R63.4] 05/31/2010    Past Medical History Past Medical History:  Diagnosis Date  . BPH (benign prostatic hyperplasia)   . Fracture   . Hypertension     Past Surgical History Past Surgical History:  Procedure Laterality Date  . APPENDECTOMY    . EYE SURGERY  1975  . HAND  SURGERY    . HERNIA REPAIR  2015    Family History Family History  Problem Relation Age of Onset  . Colon cancer Neg Hx      Social History  reports that he has been smoking cigarettes. He has been smoking about 0.50 packs per day. He has quit using smokeless tobacco.  His smokeless tobacco use included chew. He reports that he drinks alcohol. He reports that he has current or past drug history. Drugs: Marijuana and "Crack" cocaine.  Medications  Current Outpatient Medications:  .  amLODipine (NORVASC) 10 MG tablet, Take 1 tablet by mouth daily., Disp: , Rfl:  .  atorvastatin (LIPITOR) 40 MG tablet, Take 1 tablet by mouth at bedtime. , Disp: , Rfl:  .  clopidogrel (PLAVIX) 75 MG tablet, Take 1 tablet by mouth daily., Disp: , Rfl:  .  dexamethasone (DECADRON) 4 MG tablet, Take 1 tablet (4 mg total) by mouth 2 (two) times daily., Disp: 60 tablet, Rfl: 1 .  dutasteride (AVODART) 0.5 MG capsule, Take 1 capsule by mouth daily., Disp: , Rfl:  .  ferrous sulfate 325 (65 FE) MG tablet, Take 325 mg by mouth 2 (two) times daily with a meal., Disp: , Rfl:  .  lisinopril-hydrochlorothiazide (PRINZIDE,ZESTORETIC) 20-12.5 MG tablet, Take 1 tablet by  mouth 2 (two) times daily. , Disp: , Rfl:  .  morphine (MS CONTIN) 30 MG 12 hr tablet, TAKE 1 TABLET BY MOUTH EVERY 12 HOURS, Disp: 14 tablet, Rfl: 0 .  morphine (MSIR) 15 MG tablet, TAKE  (1)  TABLET  EVERY FOUR HOURS AS NEEDED FOR SEVERE PAIN.    - MAY MAKE DROWSY -, Disp: 42 tablet, Rfl: 0 .  nabumetone (RELAFEN) 750 MG tablet, Take 1 tablet by mouth 2 (two) times daily., Disp: , Rfl:  .  omeprazole (PRILOSEC) 20 MG capsule, Take 1 capsule by mouth daily., Disp: , Rfl:  .  tamsulosin (FLOMAX) 0.4 MG CAPS capsule, Take 1 tablet by mouth daily., Disp: , Rfl:  .  folic acid (FOLVITE) 1 MG tablet, Take 1 tablet (1 mg total) by mouth daily., Disp: 30 tablet, Rfl: 5  Allergies Patient has no known allergies.  Review of Systems Review of Systems -  Oncology ROS negative other than pain improved   Physical Exam  Vitals Wt Readings from Last 3 Encounters:  08/30/18 107 lb 3.2 oz (48.6 kg)  08/20/18 104 lb 9.6 oz (47.4 kg)  07/30/18 107 lb 11.2 oz (48.9 kg)   Temp Readings from Last 3 Encounters:  08/30/18 (!) 97.5 F (36.4 C) (Oral)  08/26/18 98.1 F (36.7 C) (Oral)  08/26/18 98.3 F (36.8 C) (Oral)   BP Readings from Last 3 Encounters:  08/30/18 (!) 119/57  08/26/18 132/79  08/26/18 125/67   Pulse Readings from Last 3 Encounters:  08/30/18 60  08/26/18 63  08/26/18 60   Constitutional: Well-developed, well-nourished, and in no distress.   HENT: Head: Normocephalic and atraumatic.  Mouth/Throat: No oropharyngeal exudate. Mucosa moist. Eyes: Pupils are equal, round, and reactive to light. Conjunctivae are normal. No scleral icterus.  Neck: Normal range of motion. Neck supple. No JVD present.  Cardiovascular: Normal rate, regular rhythm and normal heart sounds.  Exam reveals no gallop and no friction rub.   No murmur heard. Pulmonary/Chest: Effort normal and breath sounds normal. No respiratory distress. No wheezes.No rales.  Abdominal: Soft. Bowel sounds are normal. No distension. There is no tenderness. There is no guarding.  Musculoskeletal: No edema or tenderness.  Lymphadenopathy: No cervical, axillary  or supraclavicular adenopathy.  Neurological: Alert and oriented to person, place, and time. No cranial nerve deficit.  Skin: Skin is warm and dry. No rash noted. No erythema. No pallor.  Psychiatric: Affect and judgment normal.   Labs No visits with results within 3 Day(s) from this visit.  Latest known visit with results is:  Hospital Outpatient Visit on 08/26/2018  Component Date Value Ref Range Status  . Sodium 08/26/2018 132* 135 - 145 mmol/L Final  . Potassium 08/26/2018 3.6  3.5 - 5.1 mmol/L Final  . Chloride 08/26/2018 92* 98 - 111 mmol/L Final  . CO2 08/26/2018 27  22 - 32 mmol/L Final  . Glucose,  Bld 08/26/2018 88  70 - 99 mg/dL Final  . BUN 08/26/2018 16  8 - 23 mg/dL Final  . Creatinine, Ser 08/26/2018 0.62  0.61 - 1.24 mg/dL Final  . Calcium 08/26/2018 9.2  8.9 - 10.3 mg/dL Final  . GFR calc non Af Amer 08/26/2018 >60  >60 mL/min Final  . GFR calc Af Amer 08/26/2018 >60  >60 mL/min Final   Comment: (NOTE) The eGFR has been calculated using the CKD EPI equation. This calculation has not been validated in all clinical situations. eGFR's persistently <60 mL/min signify possible Chronic Kidney Disease.   Marland Kitchen  Anion gap 08/26/2018 13  5 - 15 Final   Performed at [facility], [place] [address] [place], [place] [address]  . WBC 08/26/2018 20.9* 4.0 - 10.5 K/uL Final  . RBC 08/26/2018 4.44  4.22 - 5.81 MIL/uL Final  . Hemoglobin 08/26/2018 13.7  13.0 - 17.0 g/dL Final  . HCT 08/26/2018 41.3  39.0 - 52.0 % Final  . MCV 08/26/2018 93.0  80.0 - 100.0 fL Final  . MCH 08/26/2018 30.9  26.0 - 34.0 pg Final  . MCHC 08/26/2018 33.2  30.0 - 36.0 g/dL Final  . RDW 08/26/2018 14.2  11.5 - 15.5 % Final  . Platelets 08/26/2018 452* 150 - 400 K/uL Final  . nRBC 08/26/2018 0.0  0.0 - 0.2 % Final  . Neutrophils Relative % 08/26/2018 83  % Final  . Neutro Abs 08/26/2018 17.3* 1.7 - 7.7 K/uL Final  . Lymphocytes Relative 08/26/2018 7  % Final  . Lymphs Abs 08/26/2018 1.4  0.7 - 4.0 K/uL Final  . Monocytes Relative 08/26/2018 9  % Final  . Monocytes Absolute 08/26/2018 1.9* 0.1 - 1.0 K/uL Final  . Eosinophils Relative 08/26/2018 0  % Final  . Eosinophils Absolute 08/26/2018 0.0  0.0 - 0.5 K/uL Final  . Basophils Relative 08/26/2018 0  % Final  . Basophils Absolute 08/26/2018 0.0  0.0 - 0.1 K/uL Final  . Immature Granulocytes 08/26/2018 1  % Final  . Abs Immature Granulocytes 08/26/2018 0.21* 0.00 - 0.07 K/uL Final   Performed at [facility], [place] [address], [place], [place] [address]  . Prothrombin Time 08/26/2018 11.3* 11.4 - 15.2 seconds Final  . INR  08/26/2018 0.83   Final   Performed at [facility], [place] [address], [place], [place] [address]     Pathology Orders Placed This Encounter  Procedures  . CBC with Differential/Platelet    Standing Status:   Future    Standing Expiration Date:   08/31/2019  . Comprehensive metabolic panel    Standing Status:   Future    Standing Expiration Date:   08/31/2019  . Lactate dehydrogenase    Standing Status:   Future    Standing Expiration Date:   08/31/2019       Zoila Shutter MD

## 2018-09-02 ENCOUNTER — Other Ambulatory Visit (HOSPITAL_COMMUNITY): Payer: Self-pay | Admitting: Internal Medicine

## 2018-09-02 ENCOUNTER — Encounter (HOSPITAL_COMMUNITY): Payer: Self-pay | Admitting: Internal Medicine

## 2018-09-02 ENCOUNTER — Ambulatory Visit
Admission: RE | Admit: 2018-09-02 | Discharge: 2018-09-02 | Disposition: A | Discharge status: Home / Self Care | Payer: Medicare HMO | Source: Ambulatory Visit | Attending: Radiation Oncology | Admitting: Radiation Oncology

## 2018-09-02 DIAGNOSIS — Z510 Encounter for antineoplastic radiation therapy: Secondary | ICD-10-CM | POA: Diagnosis not present

## 2018-09-02 DIAGNOSIS — C799 Secondary malignant neoplasm of unspecified site: Secondary | ICD-10-CM

## 2018-09-02 DIAGNOSIS — C7951 Secondary malignant neoplasm of bone: Secondary | ICD-10-CM

## 2018-09-02 HISTORY — DX: Secondary malignant neoplasm of unspecified site: C79.9

## 2018-09-03 ENCOUNTER — Other Ambulatory Visit (HOSPITAL_COMMUNITY): Payer: Self-pay | Admitting: Internal Medicine

## 2018-09-03 ENCOUNTER — Other Ambulatory Visit: Payer: Self-pay | Admitting: Pharmacist

## 2018-09-03 ENCOUNTER — Inpatient Hospital Stay (HOSPITAL_COMMUNITY): Payer: Medicare HMO

## 2018-09-03 ENCOUNTER — Other Ambulatory Visit: Payer: Self-pay

## 2018-09-03 ENCOUNTER — Encounter (HOSPITAL_COMMUNITY): Payer: Self-pay

## 2018-09-03 ENCOUNTER — Ambulatory Visit
Admission: RE | Admit: 2018-09-03 | Discharge: 2018-09-03 | Disposition: A | Discharge status: Home / Self Care | Payer: Medicare HMO | Source: Ambulatory Visit | Attending: Radiation Oncology | Admitting: Radiation Oncology

## 2018-09-03 DIAGNOSIS — R9389 Abnormal findings on diagnostic imaging of other specified body structures: Secondary | ICD-10-CM | POA: Diagnosis not present

## 2018-09-03 DIAGNOSIS — Z510 Encounter for antineoplastic radiation therapy: Secondary | ICD-10-CM | POA: Diagnosis not present

## 2018-09-03 MED ORDER — CYANOCOBALAMIN 1000 MCG/ML IJ SOLN
INTRAMUSCULAR | Status: AC
  Filled 2018-09-03: qty 1

## 2018-09-03 MED ORDER — CYANOCOBALAMIN 1000 MCG/ML IJ SOLN
1000.0000 ug | Freq: Once | INTRAMUSCULAR | Status: AC
  Administered 2018-09-03: 1000 ug via INTRAMUSCULAR

## 2018-09-04 ENCOUNTER — Encounter (HOSPITAL_COMMUNITY): Payer: Self-pay | Admitting: Internal Medicine

## 2018-09-04 ENCOUNTER — Ambulatory Visit
Admission: RE | Admit: 2018-09-04 | Discharge: 2018-09-04 | Disposition: A | Discharge status: Home / Self Care | Payer: Medicare HMO | Source: Ambulatory Visit | Attending: Radiation Oncology | Admitting: Radiation Oncology

## 2018-09-04 DIAGNOSIS — Z510 Encounter for antineoplastic radiation therapy: Secondary | ICD-10-CM | POA: Diagnosis not present

## 2018-09-04 MED ORDER — DEXAMETHASONE 4 MG PO TABS: ORAL_TABLET | ORAL | 1 refills | Status: DC

## 2018-09-04 MED ORDER — LIDOCAINE-PRILOCAINE 2.5-2.5 % EX CREA: TOPICAL_CREAM | CUTANEOUS | 3 refills | Status: DC

## 2018-09-04 MED ORDER — PROCHLORPERAZINE MALEATE 10 MG PO TABS: 10.0000 mg | ORAL_TABLET | Freq: Four times a day (QID) | ORAL | 1 refills | Status: AC | PRN

## 2018-09-04 NOTE — Patient Instructions (Signed)
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 You are going to be treated with palliative intent which means that your cancer is treatable but it is not curable. We will be giving you chemotherapy and immunotherapy every 21 days through your port a cath. We will be giving you Carboplatin (Paraplatin) Pemetrexed (Alimta) and Pembrolizumab (Keytruda).  You will see the doctor regularly throughout treatment.  We monitor your lab work prior to every treatment. The doctor monitors your response to treatment by the way you are feeling, your blood work, and scans periodically.  There will be wait times while you are here for treatment.  It will take about 30 minutes to 1 hour for your lab work to result.  Then there will be wait times while pharmacy mixes your medications.   You must take folic acid every day by mouth beginning 7 days before your first dose of alimta.  You must keep taking folic acid every day during the time you are being treated with alimta, and for every day for 21 days after you receive your last alimta dose.    You will get B12 injections while you are on alimta.  The first one about 1 week prior to starting treatment and then about every 9 weeks during treatment.    You will receive the following pre-medications prior to receiving chemotherapy:  Aloxi (IV push)- high powered nausea/vomiting prevention medication used for chemotherapy patients.   Emend - high powered nausea/vomiting prevention medication used for chemotherapy patients.  Carboplatin (Generic Name) Other Names: Paraplatin, CBDCA  About This Drug Carboplatin is a drug used to treat cancer. This drug is given in the vein (IV).  Takes 30 minutes to infuse.   Possible Side Effects (More Common) . Nausea and throwing up (vomiting). These symptoms may happen within a few hours after your treatment and may last up to 24 hours. Medicines are  available to stop or lessen these side effects. . Bone marrow depression. This is a decrease in the number of white blood cells, red blood cells, and platelets. This may raise your risk of infection, make you tired and weak (fatigue), and raise your risk of bleeding. . Soreness of the mouth and throat. You may have red areas, white patches, or sores that hurt. . This drug may affect how your kidneys work. Your kidney function will be checked as needed. . Electrolyte changes. Your blood will be checked for electrolyte changes as needed.  Possible Side Effects (Less Common) . Hair loss. Some patients lose their hair on the scalp and body. You may notice your hair thinning seven to 14 days after getting this drug. . Effects on the nerves are called peripheral neuropathy. You may feel numbness, tingling, or pain in your hands and feet. It may be hard for you to button your clothes, open jars, or walk as usual. The effect on the nerves may get worse with more doses of the drug. These effects get better in some people after the drug is stopped but it does not get better in all people. . Loose bowel movements (diarrhea) that may last for several days . Decreased hearing or ringing in the ears . Changes in the way food and drinks taste . Changes in liver function. Your liver function will be checked as needed  Allergic Reactions Serious allergic reactions including anaphylaxis are rare. While you are getting this drug in your vein (IV), tell  your nurse right away if you have any of these symptoms of an allergic reaction: . Trouble catching your breath . Feeling like your tongue or throat are swelling . Feeling your heart beat quickly or in a not normal way (palpitations) . Feeling dizzy or lightheaded . Flushing, itching, rash, and/or hives  Treating Side Effects . Drink 6-8 cups of fluids each day unless your doctor has told you to limit your fluid intake due to some other health problem. A cup is 8  ounces of fluid. If you throw up or have loose bowel movements, you should drink more fluids so that you do not become dehydrated (lack water in the body from losing too much fluid). . Mouth care is very important. Your mouth care should consist of routine, gentle cleaning of your teeth or dentures and rinsing your mouth with a mixture of 1/2 teaspoon of salt in 8 ounces of water or  teaspoon of baking soda in 8 ounces of water. This should be done at least after each meal and at bedtime. . If you have mouth sores, avoid mouthwash that has alcohol. Avoid alcohol and smoking because they can bother your mouth and throat. . If you have numbness and tingling in your hands and feet, be careful when cooking, walking, and handling sharp objects and hot liquids. . Talk with your nurse about getting a wig before you lose your hair. Also, call the [employer] at 800-ACS-2345 to find out information about the "Look Good, Feel Better" program close to where you live. It is a free program where women getting chemotherapy can learn about wigs, turbans and scarves as well as makeup techniques and skin and nail care.  Food and Drug Interactions There are no known interactions of carboplatin with food. This drug may interact with other medicines. Tell your doctor and pharmacist about all the medicines and dietary supplements (vitamins, minerals, herbs and others) that you are taking at this time. The safety and use of dietary supplements and alternative diets are often not known. Using these might affect your cancer or interfere with your treatment. Until more is known, you should not use dietary supplements or alternative diets without your cancer doctor's help.  When to Call the Doctor Call your doctor or nurse right away if you have any of these symptoms: . Fever of 100.5 F (38 C) or above; chills . Bleeding or bruising that is not normal . Wheezing or trouble breathing . Nausea that stops you from  eating or drinking . Throwing up more than once a day . Rash or itching . Loose bowel movements (diarrhea) more than four times a day or diarrhea with weakness or feeling lightheaded . Call your doctor or nurse as soon as possible if any of these symptoms happen: . Numbness, tingling, decreased feeling or weakness in fingers, toes, arms, or legs . Change in hearing, ringing in the ears . Blurred vision or other changes in eyesight . Decreased urine . Yellowing of skin or eyes  Sexual Problems and Reproductive Concerns Sexual problems and reproduction concerns may happen. In both men and women, this drug may affect your ability to have children. This cannot be determined before your treatment. Talk with your doctor or nurse if you plan to have children. Ask for information on sperm or egg banking. In men, this drug may interfere with your ability to make sperm, but it should not change your ability to have sexual relations. In women, menstrual bleeding may become  irregular or stop while you are getting this drug. Do not assume that you cannot become pregnant if you do not have a menstrual period. Women may go through signs of menopause (change of life) like vaginal dryness or itching. Vaginal lubricants can be used to lessen vaginal dryness, itching, and pain during sexual relations. Genetic counseling is available for you to talk about the effects of this drug therapy on future pregnancies. Also, a genetic counselor can look at the possible risk of problems in the unborn baby due to this medicine if an exposure happens during pregnancy. . Pregnancy warning: This drug may have harmful effects on the unborn child, so effective methods of birth control should be used during your cancer treatment. . Breast feeding warning: It is not known if this drug passes into breast milk. For this reason, women should talk to their doctor about the risks and benefits of breast feeding during treatment with this drug  because this drug may enter the breast milk and badly harm a breast feeding baby.   Pemetrexed (Generic Name) Other Names: ALIMTA  About This Drug Pemetrexed is used to treat cancer. It is given in the vein (IV).  Takes 10 minutes to infuse.   Possible Side Effects (More Common) . Bone marrow depression. This is a decrease in the number of white blood cells, red blood cells, and platelets. This may raise your risk of infection, make you tired and weak (fatigue), and raise your risk of bleeding. . Fatigue . Soreness of the mouth and throat. You may have red areas, white patches, or sores that hurt.  Possible Side Effects (less common) . Trouble breathing or feeling short of breath . Nausea and throwing up (vomiting) . Skin rash  Treating Side Effects . Ask your doctor or nurse about medicine that is available to help stop or lessen nausea and throwing up. . If you get a rash, do not put anything on it unless your doctor or nurse says you may. Keep the area around the rash clean and dry. . Mouth care is very important. Your mouth care should consist of routine, gentle cleaning of your teeth or dentures and rinsing your mouth with a mixture of 1/2 teaspoon of salt in 8 ounces of water or  teaspoon of baking soda in 8 ounces of water. This should be done at least after each meal and at bedtime. . If you have mouth sores, avoid mouthwash that has alcohol. Avoid alcohol and smoking because they can bother your mouth and throat.  Food and Drug Interactions There are no known interactions of this medicine with food. Tell your doctor if you are taking ibuprofen. Pemetrexed may interact with other medicines. Tell your doctor and pharmacist about all the medicines and dietary supplements (vitamins, minerals, herbs and others) that you are taking at this time. The safety and use of dietary supplements and alternative diets are often not known. Using these might affect your cancer or interfere with  your treatment. Until more is known, you should not use dietary supplements or alternative diets without your cancer doctor's help.  When to Call the Doctor Call your doctor or nurse right away if you have any of these symptoms: . Temperature of 100.5 F (38 C) or above . Chills . Easy bruising or bleeding . Trouble breathing . Chest pain . Nausea that stops you from eating or drinking . Throwing up more than 3 times in a day Call your doctor or nurse as soon as  possible if you have any of these symptoms: . Rash that does not go away with prescribed medicine . Nausea or vomiting that does not go away with prescribed medicine . Extreme fatigue that interferes with normal activities  Reproduction Concerns . Pregnancy warning: This drug may have harmful effects on the unborn child, so effective methods of birth control should be used during your cancer treatment. . Genetic counseling is available for you to talk about the effects of this drug therapy on future pregnancies. Also, a genetic counselor can look at the possible risk of problems in the unborn baby due to this medicine if an exposure happens during pregnancy. . Breast feeding warning: It is not known if this drug passes into breast milk. For this reason, women should talk to their doctor about the risks and benefits of breast feeding during treatment with this drug because this drug may enter the breast milk and badly harm a breast feeding baby.  Pembrolizumab Beryle Flock)  About This Drug Pembrolizumab is used to treat cancer. It is given in the vein (IV).  This drug will take 30 minutes to infuse.  Possible Side Effects . Tiredness . Fever . Nausea . Decreased appetite (decreased hunger) . Loose bowel movements (diarrhea) . Constipation (not able to move bowels) . Trouble breathing . Rash . Itching . Muscle and bone pain . Cough Note: Each of the side effects above was reported in 20% or greater of patients treated with  pembrolizumab. Not all possible side effects are included above.  Warnings and Precautions . This drug works with your immune system and can cause inflammation in any of your organs and tissues and can change how they work. This may put you at risk for developing serious medical problems which can very rarely be fatal. . Colitis (swelling (inflammation) in the colon) - symptoms are loose bowel movements (diarrhea) stomach cramping, and sometimes blood in the bowel movements . Changes in liver function. Your liver function will be checked as needed. . Changes in kidney function, which can very rarely be fatal. Your kidney function will be checked as needed. . Inflammation (swelling) of the lungs which can very rarely be fatal - you may have a dry cough or trouble breathing. . This drug may affect some of your hormone glands (especially the thyroid, adrenals, pituitary and pancreas). Your hormone levels will be checked as needed. . Blood sugar levels may change and you may develop diabetes. If you already have diabetes, changes may need to be made to your diabetes medication. . Severe allergic skin reaction, which can very rarely be fatal. You may develop blisters on your skin that are filled with fluid or a severe red rash all over your body that may be painful. . Increased risk of organ rejection in patients who have received donor organs . Increased risk of complications in patients who will undergo a stem cell transplant after receiving pembrolizumab. . While you are getting this drug in your vein (IV), you may have a reaction to the drug. Your nurse will check you closely for these signs: fever or shaking chills, flushing, facial swelling, feeling dizzy, headache, trouble breathing, rash, itching, chest tightness, or chest pain. These reactions may occur after your infusion. If this happens, call 911 for emergency care.  Important Information . This drug may be present in the saliva, tears, sweat,  urine, stool, vomit, semen, and vaginal secretions. Talk to your doctor and/or your nurse about the necessary precautions to take during this  time.  Treating Side Effects . Ask your doctor or nurse about medicines that are available to help stop or lessen constipation, diarrhea and/or nausea. . Drink plenty of fluids (a minimum of eight glasses per day is recommended). . If you are not able to move your bowels, check with your doctor or nurse before you use any enemas, laxatives, or suppositories . To help with nausea and vomiting, eat small, frequent meals instead of three large meals a day. Choose foods and drinks that are at room temperature. Ask your nurse or doctor about other helpful tips and medicine that is available to help or stop lessen these symptoms. . If you get diarrhea, eat low-fiber foods that are high in protein and calories and avoid foods that can irritate your digestive tracts or lead to cramping. Ask your nurse or doctor about medicine that can lessen or stop your diarrhea. . Manage tiredness by pacing your activities for the day. Be sure to include periods of rest between energy-draining activities . Keeping your pain under control is important to your wellbeing. Please tell your doctor or nurse if you are experiencing pain. . If you have diabetes, keep good control of your blood sugar level. Tell your nurse or your doctor if your glucose levels are higher or lower than normal . If you get a rash do not put anything on it unless your doctor or nurse says you may. Keep the area around the rash clean and dry. Ask your doctor for medicine if your rash bothers you. . Infusion reactions may happen for 24 hours after your infusion. If this happens, call 911 for emergency care.  Food and Drug Interactions . There are no known interactions of pembrolizumab with food. . There are no known interactions of pembrolizumab with other medications. . Tell your doctor and pharmacist about all  the medicines and dietary supplements (vitamins, minerals, herbs and others) that you are taking at this time. The safety and use of dietary supplements and alternative agents are often not known. Using these might affect your cancer or interfere with your treatment. Until more is known, you should not use dietary supplements or alternative agents without your cancer doctor's help.   When to Call the Doctor Call your doctor or nurse if you have any of the following symptoms and/or any new or unusual symptoms: . Fever of 100.5 F (38 C) or higher . Chills . Wheezing or trouble breathing . Rash or itching . Feeling dizzy or lightheaded . Loose bowel movements (diarrhea) more than 4 times a day or diarrhea with weakness or lightheadedness . Nausea that stops you from eating or drinking, and/or that is not relieved by prescribed medicines . Lasting loss of appetite or rapid weight loss of five pounds in a week . Fatigue that interferes with your daily activities . No bowel movement for 3 days or you feel uncomfortable . Extreme weakness that interferes with normal activities . Bad abdominal pain, especially in upper right area . Decreased urine . Unusual thirst or passing urine often . Rash that is not relieved by prescribed medicines . Flu-like symptoms: fever, headache, muscle and joint aches, and fatigue (low energy, feeling weak) . Signs of liver problems: dark urine, pale bowel movements, bad stomach pain, feeling very tired and weak, unusual itching, or yellowing of the eyes or skin . Signs of infusion reactions such as fever or shaking chills, flushing, facial swelling, feeling dizzy, headache, trouble breathing, rash, itching, chest tightness, or chest pain. Marland Kitchen  If you think you are pregnant  Reproduction Warnings . Pregnancy warning: This drug may have harmful effects on the unborn baby. Women of childbearing potential should use effective methods of birth control during your cancer  treatment and for at least 4 months after treatment. Let your doctor know right away if you think you may be pregnant . Breast feeding warning: It is not known if this drug passes into breast milk. It is recommended that women do not breastfeed during treatment and for 4 months after treatment. . Fertility warning: Human fertility studies have not been done with this drug. Talk with your doctor or nurse if you plan to have children.  SELF CARE ACTIVITIES WHILE ON CHEMOTHERAPY:  Hydration Increase your fluid intake 48 hours prior to treatment and drink at least 8 to 12 cups (64 ounces) of water/decaffeinated beverages per day after treatment. You can still have your cup of coffee or soda but these beverages do not count as part of your 8 to 12 cups that you need to drink daily. No alcohol intake.  Medications Continue taking your normal prescription medication as prescribed.  If you start any new herbal or new supplements please let us know first to make sure it is safe.  Mouth Care Have teeth cleaned professionally before starting treatment. Keep dentures and partial plates clean. Use soft toothbrush and do not use mouthwashes that contain alcohol. Biotene is a good mouthwash that is available at most pharmacies or may be ordered by calling [phone]. Use warm salt water gargles (1 teaspoon salt per 1 quart warm water) before and after meals and at bedtime. Or you may rinse with 2 tablespoons of three-percent hydrogen peroxide mixed in eight ounces of water. If you are still having problems with your mouth or sores in your mouth please call the clinic. If you need dental work, please let the doctor know before you go for your appointment so that we can coordinate the best possible time for you in regards to your chemo regimen. You need to also let your dentist know that you are actively taking chemo. We may need to do labs prior to your dental appointment.  Skin Care Always use sunscreen that  has not expired and with SPF (Sun Protection Factor) of 50 or higher. Wear hats to protect your head from the sun. Remember to use sunscreen on your hands, ears, face, & feet.  Use good moisturizing lotions such as udder cream, eucerin, or even Vaseline. Some chemotherapies can cause dry skin, color changes in your skin and nails.    . Avoid long, hot showers or baths. . Use gentle, fragrance-free soaps and laundry detergent. . Use moisturizers, preferably creams or ointments rather than lotions because the thicker consistency is better at preventing skin dehydration. Apply the cream or ointment within 15 minutes of showering. Reapply moisturizer at night, and moisturize your hands every time after you wash them.  Hair Loss (if your doctor says your hair will fall out)  . If your doctor says that your hair is likely to fall out, decide before you begin chemo whether you want to wear a wig. You may want to shop before treatment to match your hair color. . Hats, turbans, and scarves can also camouflage hair loss, although some people prefer to leave their heads uncovered. If you go bare-headed outdoors, be sure to use sunscreen on your scalp. . Cut your hair short. It eases the inconvenience of shedding lots of hair, but it  also can reduce the emotional impact of watching your hair fall out. . Don't perm or color your hair during chemotherapy. Those chemical treatments are already damaging to hair and can enhance hair loss. Once your chemo treatments are done and your hair has grown back, it's OK to resume dyeing or perming hair. With chemotherapy, hair loss is almost always temporary. But when it grows back, it may be a different color or texture. In older adults who still had hair color before chemotherapy, the new growth may be completely gray.  Often, new hair is very fine and soft.  Infection Prevention Please wash your hands for at least 30 seconds using warm soapy water. Handwashing is the #1 way  to prevent the spread of germs. Stay away from sick people or people who are getting over a cold. If you develop respiratory systems such as green/yellow mucus production or productive cough or persistent cough let us know and we will see if you need an antibiotic. It is a good idea to keep a pair of gloves on when going into grocery stores/[employer] to decrease your risk of coming into contact with germs on the carts, etc. Carry alcohol hand gel with you at all times and use it frequently if out in public. If your temperature reaches 100.5 or higher please call the clinic and let us know.  If it is after hours or on the weekend please go to the ER if your temperature is over 100.5.  Please have your own personal thermometer at home to use.    Sex and bodily fluids If you are going to have sex, a condom must be used to protect the person that isn't taking chemotherapy. Chemo can decrease your libido (sex drive). For a few days after chemotherapy, chemotherapy can be excreted through your bodily fluids.  When using the toilet please close the lid and flush the toilet twice.  Do this for a few day after you have had chemotherapy.   Effects of chemotherapy on your sex life Some changes are simple and won't last long. They won't affect your sex life permanently. Sometimes you may feel: . too tired . not strong enough to be very active . sick or sore  . not in the mood . anxious or low Your anxiety might not seem related to sex. For example, you may be worried about the cancer and how your treatment is going. Or you may be worried about money, or about how you family are coping with your illness. These things can cause stress, which can affect your interest in sex. It's important to talk to your partner about how you feel. Remember - the changes to your sex life don't usually last long. There's usually no medical reason to stop having sex during chemo. The drugs won't have any long term physical effects on  your performance or enjoyment of sex. Cancer can't be passed on to your partner during sex  Contraception It's important to use reliable contraception during treatment. Avoid getting pregnant while you or your partner are having chemotherapy. This is because the drugs may harm the baby. Sometimes chemotherapy drugs can leave a man or woman infertile.  This means you would not be able to have children in the future. You might want to talk to someone about permanent infertility. It can be very difficult to learn that you may no longer be able to have children. Some people find counselling helpful. There might be ways to preserve your fertility, although  this is easier for men than for women. You may want to speak to a fertility expert. You can talk about sperm banking or harvesting your eggs. You can also ask about other fertility options, such as donor eggs. If you have or have had breast cancer, your doctor might advise you not to take the contraceptive pill. This is because the hormones in it might affect the cancer.  It is not known for sure whether or not chemotherapy drugs can be passed on through semen or secretions from the vagina. Because of this some doctors advise people to use a barrier method if you have sex during treatment. This applies to vaginal, anal or oral sex. Generally, doctors advise a barrier method only for the time you are actually having the treatment and for about a week after your treatment. Advice like this can be worrying, but this does not mean that you have to avoid being intimate with your partner. You can still have close contact with your partner and continue to enjoy sex.  Animals If you have cats or birds we just ask that you not change the litter or change the cage.  Please have someone else do this for you while you are on chemotherapy.   Food Safety During and After Cancer Treatment Food safety is important for people both during and after cancer treatment. Cancer  and cancer treatments, such as chemotherapy, radiation therapy, and stem cell/bone marrow transplantation, often weaken the immune system. This makes it harder for your body to protect itself from foodborne illness, also called food poisoning. Foodborne illness is caused by eating food that contains harmful bacteria, parasites, or viruses.  Foods to avoid Some foods have a higher risk of becoming tainted with bacteria. These include: Marland Kitchen Unwashed fresh fruit and vegetables, especially leafy vegetables that can hide dirt and other contaminants . Raw sprouts, such as alfalfa sprouts . Raw or undercooked beef, especially ground beef, or other raw or undercooked meat and poultry . Fatty, fried, or spicy foods immediately before or after treatment.  These can sit heavy on your stomach and make you feel nauseous. . Raw or undercooked shellfish, such as oysters. . Sushi and sashimi, which often contain raw fish.  . Unpasteurized beverages, such as unpasteurized fruit juices, raw milk, raw yogurt, or cider . Undercooked eggs, such as soft boiled, over easy, and poached; raw, unpasteurized eggs; or foods made with raw egg, such as homemade raw cookie dough and homemade mayonnaise Simple steps for food safety Shop smart. . Do not buy food stored or displayed in an unclean area. . Do not buy bruised or damaged fruits or vegetables. . Do not buy cans that have cracks, dents, or bulges. . Pick up foods that can spoil at the end of your shopping trip and store them in a cooler on the way home. Prepare and clean up foods carefully. . Rinse all fresh fruits and vegetables under running water, and dry them with a clean towel or paper towel. . Clean the top of cans before opening them. . After preparing food, wash your hands for 20 seconds with hot water and soap. Pay special attention to areas between fingers and under nails. . Clean your utensils and dishes with hot water and soap. Marland Kitchen Disinfect your kitchen and  cutting boards using 1 teaspoon of liquid, unscented bleach mixed into 1 quart of water.   Dispose of old food. . Eat canned and packaged food before its expiration date (the "use by"  or "best before" date). . Consume refrigerated leftovers within 3 to 4 days. After that time, throw out the food. Even if the food does not smell or look spoiled, it still may be unsafe. Some bacteria, such as Listeria, can grow even on foods stored in the refrigerator if they are kept for too long. Take precautions when eating out. . At restaurants, avoid buffets and salad bars where food sits out for a long time and comes in contact with many people. Food can become contaminated when someone with a virus, often a norovirus, or another "bug" handles it. . Put any leftover food in a "to-go" container yourself, rather than having the server do it. And, refrigerate leftovers as soon as you get home. . Choose restaurants that are clean and that are willing to prepare your food as you order it cooked.   MEDICATIONS:                                                                                                                                                                Compazine/Prochlorperazine 10mg  tablet. Take 1 tablet every 6 hours as needed for nausea/vomiting. (This can make you sleepy)   EMLA cream. Apply a quarter size amount to port site 1 hour prior to chemo. Do not rub in. Cover with plastic wrap.   Over-the-Counter Meds:  Colace - 100 mg capsules - take 2 capsules daily.  If this doesn't help then you can increase to 2 capsules twice daily.  Call us if this does not help your bowels move.   Imodium 2mg  capsule. Take 2 capsules after the 1st loose stool and then 1 capsule every 2 hours until you go a total of 12 hours without having a loose stool. Call the [facility] if loose stools continue. If diarrhea occurs at bedtime, take 2 capsules at bedtime. Then take 2 capsules every 4 hours until  morning. Call [facility].    Diarrhea Sheet   If you are having loose stools/diarrhea, please purchase Imodium and begin taking as outlined:  At the first sign of poorly formed or loose stools you should begin taking Imodium (loperamide) 2 mg capsules.  Take two caplets (4mg ) followed by one caplet (2mg ) every 2 hours until you have had no diarrhea for 12 hours.  During the night take two caplets (4mg ) at bedtime and continue every 4 hours during the night until the morning.  Stop taking Imodium only after there is no sign of diarrhea for 12 hours.    Always call the [facility] if you are having loose stools/diarrhea that you can't get under control.  Loose stools/diarrhea leads to dehydration (loss of water) in your body.  We have other options of trying to get the loose stools/diarrhea to stop  but you must let us know!   Constipation Sheet  Colace - 100 mg capsules - take 2 capsules daily.  If this doesn't help then you can increase to 2 capsules twice daily.  Please call if the above does not work for you.   Do not go more than 2 days without a bowel movement.  It is very important that you do not become constipated.  It will make you feel sick to your stomach (nausea) and can cause abdominal pain and vomiting.   Nausea Sheet   Compazine/Prochlorperazine 10mg  tablet. Take 1 tablet every 6 hours as needed for nausea/vomiting. (This can make you sleepy)  If you are having persistent nausea (nausea that does not stop) please call the [facility] and let us know the amount of nausea that you are experiencing.  If you begin to vomit, you need to call the [facility] and if it is the weekend and you have vomited more than one time and can't get it to stop-go to the Emergency Room.  Persistent nausea/vomiting can lead to dehydration (loss of fluid in your body) and will make you feel terrible.   Ice chips, sips of clear liquids, foods that are @ room temperature, crackers, and toast  tend to be better tolerated.   SYMPTOMS TO REPORT AS SOON AS POSSIBLE AFTER TREATMENT:   FEVER GREATER THAN 100.5 F  CHILLS WITH OR WITHOUT FEVER  NAUSEA AND VOMITING THAT IS NOT CONTROLLED WITH YOUR NAUSEA MEDICATION  UNUSUAL SHORTNESS OF BREATH  UNUSUAL BRUISING OR BLEEDING  TENDERNESS IN MOUTH AND THROAT WITH OR WITHOUT PRESENCE OF ULCERS  URINARY PROBLEMS  BOWEL PROBLEMS  UNUSUAL RASH      Wear comfortable clothing and clothing appropriate for easy access to any Portacath or PICC line. Let us know if there is anything that we can do to make your therapy better!    What to do if you need assistance after hours or on the weekends: CALL [phone].  HOLD on the line, do not hang up.  You will hear multiple messages but at the end you will be connected with a nurse triage line.  They will contact the doctor if necessary.  Most of the time they will be able to assist you.  Do not call the hospital operator.      I have been informed and understand all of the instructions given to me and have received a copy. I have been instructed to call the clinic [phone] or my family physician as soon as possible for continued medical care, if indicated. I do not have any more questions at this time but understand that I may call the [facility] or the Patient Navigator at [phone] during office hours should I have questions or need assistance in obtaining follow-up care.

## 2018-09-04 NOTE — Progress Notes (Signed)
Chemotherapy and immunotherapy teaching pulled together.

## 2018-09-05 ENCOUNTER — Ambulatory Visit
Admission: RE | Admit: 2018-09-05 | Discharge: 2018-09-05 | Disposition: A | Discharge status: Home / Self Care | Payer: Medicare HMO | Source: Ambulatory Visit | Attending: Radiation Oncology | Admitting: Radiation Oncology

## 2018-09-05 DIAGNOSIS — Z510 Encounter for antineoplastic radiation therapy: Secondary | ICD-10-CM | POA: Diagnosis not present

## 2018-09-06 ENCOUNTER — Ambulatory Visit
Admission: RE | Admit: 2018-09-06 | Discharge: 2018-09-06 | Disposition: A | Discharge status: Home / Self Care | Payer: Medicare HMO | Source: Ambulatory Visit | Attending: Radiation Oncology | Admitting: Radiation Oncology

## 2018-09-06 DIAGNOSIS — Z510 Encounter for antineoplastic radiation therapy: Secondary | ICD-10-CM | POA: Diagnosis not present

## 2018-09-07 ENCOUNTER — Encounter: Payer: Self-pay | Admitting: Hematology

## 2018-09-09 ENCOUNTER — Ambulatory Visit
Admission: RE | Admit: 2018-09-09 | Discharge: 2018-09-09 | Disposition: A | Discharge status: Home / Self Care | Payer: Medicare HMO | Source: Ambulatory Visit | Attending: Radiation Oncology | Admitting: Radiation Oncology

## 2018-09-09 DIAGNOSIS — Z510 Encounter for antineoplastic radiation therapy: Secondary | ICD-10-CM | POA: Diagnosis not present

## 2018-09-10 ENCOUNTER — Ambulatory Visit
Admission: RE | Admit: 2018-09-10 | Discharge: 2018-09-10 | Disposition: A | Discharge status: Home / Self Care | Payer: Medicare HMO | Source: Ambulatory Visit | Attending: Radiation Oncology | Admitting: Radiation Oncology

## 2018-09-10 ENCOUNTER — Encounter: Payer: Self-pay | Admitting: Radiation Oncology

## 2018-09-10 DIAGNOSIS — Z510 Encounter for antineoplastic radiation therapy: Secondary | ICD-10-CM | POA: Diagnosis not present

## 2018-09-12 ENCOUNTER — Encounter: Payer: Self-pay | Admitting: General Surgery

## 2018-09-12 ENCOUNTER — Ambulatory Visit (INDEPENDENT_AMBULATORY_CARE_PROVIDER_SITE_OTHER): Payer: Medicare HMO | Admitting: General Surgery

## 2018-09-12 ENCOUNTER — Encounter (HOSPITAL_COMMUNITY): Payer: Self-pay | Admitting: *Deleted

## 2018-09-12 ENCOUNTER — Encounter (HOSPITAL_COMMUNITY)
Admission: RE | Admit: 2018-09-12 | Discharge: 2018-09-12 | Disposition: A | Discharge status: Home / Self Care | Payer: Medicare HMO | Source: Ambulatory Visit | Attending: General Surgery | Admitting: General Surgery

## 2018-09-12 ENCOUNTER — Encounter (HOSPITAL_COMMUNITY): Payer: Self-pay

## 2018-09-12 VITALS — BP 92/52 | HR 75 | Temp 98.7°F | Resp 16 | Wt 103.6 lb

## 2018-09-12 DIAGNOSIS — C3412 Malignant neoplasm of upper lobe, left bronchus or lung: Secondary | ICD-10-CM | POA: Diagnosis not present

## 2018-09-12 DIAGNOSIS — C3492 Malignant neoplasm of unspecified part of left bronchus or lung: Secondary | ICD-10-CM

## 2018-09-12 DIAGNOSIS — C7951 Secondary malignant neoplasm of bone: Secondary | ICD-10-CM

## 2018-09-12 NOTE — Patient Instructions (Signed)
Implanted Port Insertion  Implanted port insertion is a procedure to put in a port and catheter. The port is a device with an injectable disk that can be accessed by your health care provider. The port is connected to a vein in the chest or neck by a small flexible tube (catheter). There are different types of ports. The implanted port may be used as a long-term IV access for:  · Medicines, such as chemotherapy.  · Fluids.  · Liquid nutrition, such as total parenteral nutrition (TPN).  · Blood samples.    Having a port means that your health care provider will not need to use the veins in your arms for these procedures.  Tell a health care provider about:  · Any allergies you have.  · All medicines you are taking, especially blood thinners, as well as any vitamins, herbs, eye drops, creams, over-the-counter medicines, and steroids.  · Any problems you or family members have had with anesthetic medicines.  · Any blood disorders you have.  · Any surgeries you have had.  · Any medical conditions you have, including diabetes or kidney problems.  · Whether you are pregnant or may be pregnant.  What are the risks?  Generally, this is a safe procedure. However, problems may occur, including:  · Allergic reactions to medicines or dyes.  · Damage to other structures or organs.  · Infection.  · Damage to the blood vessel, bruising, or bleeding at the puncture site.  · Blood clot.  · Breakdown of the skin over the port.  · A collection of air in the chest that can cause one of the lungs to collapse (pneumothorax). This is rare.    What happens before the procedure?  Staying hydrated  Follow instructions from your health care provider about hydration, which may include:  · Up to 2 hours before the procedure - you may continue to drink clear liquids, such as water, clear fruit juice, black coffee, and plain tea.    Eating and drinking restrictions  · Follow instructions from your health care provider about eating and drinking,  which may include:  ? 8 hours before the procedure - stop eating heavy meals or foods such as meat, fried foods, or fatty foods.  ? 6 hours before the procedure - stop eating light meals or foods, such as toast or cereal.  ? 6 hours before the procedure - stop drinking milk or drinks that contain milk.  ? 2 hours before the procedure - stop drinking clear liquids.  Medicines  · Ask your health care provider about:  ? Changing or stopping your regular medicines. This is especially important if you are taking diabetes medicines or blood thinners.  ? Taking medicines such as aspirin and ibuprofen. These medicines can thin your blood. Do not take these medicines before your procedure if your health care provider instructs you not to.  · You may be given antibiotic medicine to help prevent infection.  General instructions  · Plan to have someone take you home from the hospital or clinic.  · If you will be going home right after the procedure, plan to have someone with you for 24 hours.  · You may have blood tests.  · You may be asked to shower with a germ-killing soap.  What happens during the procedure?  · To lower your risk of infection:  ? Your health care team will wash or sanitize their hands.  ? Your skin will be washed with   soap.  ? Hair may be removed from the surgical area.  · An IV tube will be inserted into one of your veins.  · You will be given one or more of the following:  ? A medicine to help you relax (sedative).  ? A medicine to numb the area (local anesthetic).  · Two small cuts (incisions) will be made to insert the port.  ? One incision will be made in your neck to get access to the vein where the catheter will lie.  ? The other incision will be made in the upper chest. This is where the port will lie.  · The procedure may be done using continuous X-ray (fluoroscopy) or other imaging tools for guidance.  · The port and catheter will be placed. There may be a small, raised area where the port  is.  · The port will be flushed with a salt solution (saline), and blood will be drawn to make sure that it is working correctly.  · The incisions will be closed.  · Bandages (dressings) may be placed over the incisions.  The procedure may vary among health care providers and hospitals.  What happens after the procedure?  · Your blood pressure, heart rate, breathing rate, and blood oxygen level will be monitored until the medicines you were given have worn off.  · Do not drive for 24 hours if you were given a sedative.  · You will be given a manufacturer's information card for the type of port that you have. Keep this with you.  · Your port will need to be flushed and checked as told by your health care provider, usually every few weeks.  · A chest X-ray will be done to:  ? Check the placement of the port.  ? Make sure there is no injury to your lung.  Summary  · Implanted port insertion is a procedure to put in a port and catheter.  · The implanted port is used as a long-term IV access.  · The port will need to be flushed and checked as told by your health care provider, usually every few weeks.  · Keep your manufacturer's information card with you at all times.  This information is not intended to replace advice given to you by your health care provider. Make sure you discuss any questions you have with your health care provider.  Document Released: 08/20/2013 Document Revised: 09/20/2016 Document Reviewed: 09/20/2016  Elsevier Interactive Patient Education © 2017 [employer]vier Inc.

## 2018-09-12 NOTE — Progress Notes (Signed)
Patient's sister, Gregory Hickman, called today stating that patient had a lot of blood in his stool this morning.  She states that he has started taking the stool softener twice daily and has been having regular bowel movements. She states that this morning there was a lot of blood in the commode as well as on the toilet tissue.    I asked that she come in to obtain occult stool cards for further testing.   I provided education to both Gregory Hickman and his sister on how to use the stool cards and when to return them to the clinic.  They verbalize understanding.

## 2018-09-12 NOTE — Progress Notes (Signed)
Gregory Hickman; [id number]; [birth year]   HPI Patient is a 67 year old black male who was referred to my care by Dr. Delton Coombes for Port-A-Cath placement.  He has a left lung carcinoma and is undergoing chemotherapy and needs central venous access.  He currently has nonspecific pain which is 6 out of 10.  He is to undergo chemotherapy on 09/16/2018. Past Medical History:  Diagnosis Date  . BPH (benign prostatic hyperplasia)   . Fracture   . Hypertension   . Metastasis ([place]) 09/02/2018    Past Surgical History:  Procedure Laterality Date  . APPENDECTOMY    . EYE SURGERY  1975  . HAND SURGERY    . HERNIA REPAIR  2015    Family History  Problem Relation Age of Onset  . Colon cancer Neg Hx     Current Outpatient Medications on File Prior to Visit  Medication Sig Dispense Refill  . amLODipine (NORVASC) 10 MG tablet Take 1 tablet by mouth daily.    Marland Kitchen atorvastatin (LIPITOR) 40 MG tablet Take 1 tablet by mouth at bedtime.     Marland Kitchen dexamethasone (DECADRON) 4 MG tablet Take 1 tablet (4 mg total) by mouth 2 (two) times daily. 60 tablet 1  . dutasteride (AVODART) 0.5 MG capsule Take 1 capsule by mouth daily.    . ferrous sulfate 325 (65 FE) MG tablet Take 325 mg by mouth 2 (two) times daily with a meal.    . folic acid (FOLVITE) 1 MG tablet Take 1 tablet (1 mg total) by mouth daily. 30 tablet 5  . lisinopril-hydrochlorothiazide (PRINZIDE,ZESTORETIC) 20-12.5 MG tablet Take 1 tablet by mouth 2 (two) times daily.     Marland Kitchen morphine (MS CONTIN) 30 MG 12 hr tablet TAKE 1 TABLET BY MOUTH EVERY 12 HOURS 14 tablet 0  . morphine (MSIR) 15 MG tablet TAKE  (1)  TABLET  EVERY FOUR HOURS AS NEEDED FOR SEVERE PAIN.    - MAY MAKE DROWSY - 42 tablet 0  . nabumetone (RELAFEN) 750 MG tablet Take 1 tablet by mouth 2 (two) times daily.    Marland Kitchen omeprazole (PRILOSEC) 20 MG capsule Take 1 capsule by mouth daily.     No current facility-administered medications on file prior to visit.     No Known Allergies  Social  History   Substance and Sexual Activity  Alcohol Use Yes   Comment: As of 07/30/18 (1) 40 ounce beer daily    Social History   Tobacco Use  Smoking Status Current Every Day Smoker  . Packs/day: 0.50  . Types: Cigarettes  Smokeless Tobacco Former Systems developer  . Types: Chew  Tobacco Comment   one pack of cigs last 3 days    Review of Systems  Constitutional: Positive for malaise/fatigue.  HENT: Negative.   Eyes: Positive for blurred vision.  Respiratory: Positive for cough.   Cardiovascular: Negative.   Gastrointestinal: Negative.   Genitourinary: Positive for frequency.  Musculoskeletal: Positive for back pain, joint pain and neck pain.  Skin: Negative.   Neurological: Negative.   Endo/Heme/Allergies: Negative.   Psychiatric/Behavioral: Negative.     Objective   Vitals:   09/12/18 0951  BP: (!) 92/52  Pulse: 75  Resp: 16  Temp: 98.7 F (37.1 C)    Physical Exam  Constitutional: He is oriented to person, place, and time. He appears well-developed and well-nourished. No distress.  HENT:  Head: Normocephalic and atraumatic.  Cardiovascular: Normal rate, regular rhythm and normal heart sounds. Exam reveals no gallop and no friction  rub.  No murmur heard. Pulmonary/Chest: Effort normal and breath sounds normal. No stridor. No respiratory distress. He has no wheezes. He has no rales.  Neurological: He is alert and oriented to person, place, and time.  Skin: Skin is warm and dry.  Vitals reviewed. Oncology notes reviewed  Assessment  Left lung carcinoma with metastatic disease, need for central venous access Plan   Patient is scheduled to undergo a Port-A-Cath insertion on 09/13/2018.  The risks and benefits of the procedure including bleeding, infection, and pneumothorax were fully explained to the patient, who gave informed consent.

## 2018-09-12 NOTE — H&P (Signed)
Gregory Hickman; [id number]; April 22, 1951   HPI Patient is a 67 year old black male who was referred to my care by Dr. Delton Coombes for Port-A-Cath placement.  He has a left lung carcinoma and is undergoing chemotherapy and needs central venous access.  He currently has nonspecific pain which is 6 out of 10.  He is to undergo chemotherapy on 09/16/2018. Past Medical History:  Diagnosis Date  . BPH (benign prostatic hyperplasia)   . Fracture   . Hypertension   . Metastasis ([place]) 09/02/2018    Past Surgical History:  Procedure Laterality Date  . APPENDECTOMY    . EYE SURGERY  1975  . HAND SURGERY    . HERNIA REPAIR  2015    Family History  Problem Relation Age of Onset  . Colon cancer Neg Hx     Current Outpatient Medications on File Prior to Visit  Medication Sig Dispense Refill  . amLODipine (NORVASC) 10 MG tablet Take 1 tablet by mouth daily.    Marland Kitchen atorvastatin (LIPITOR) 40 MG tablet Take 1 tablet by mouth at bedtime.     Marland Kitchen dexamethasone (DECADRON) 4 MG tablet Take 1 tablet (4 mg total) by mouth 2 (two) times daily. 60 tablet 1  . dutasteride (AVODART) 0.5 MG capsule Take 1 capsule by mouth daily.    . ferrous sulfate 325 (65 FE) MG tablet Take 325 mg by mouth 2 (two) times daily with a meal.    . folic acid (FOLVITE) 1 MG tablet Take 1 tablet (1 mg total) by mouth daily. 30 tablet 5  . lisinopril-hydrochlorothiazide (PRINZIDE,ZESTORETIC) 20-12.5 MG tablet Take 1 tablet by mouth 2 (two) times daily.     Marland Kitchen morphine (MS CONTIN) 30 MG 12 hr tablet TAKE 1 TABLET BY MOUTH EVERY 12 HOURS 14 tablet 0  . morphine (MSIR) 15 MG tablet TAKE  (1)  TABLET  EVERY FOUR HOURS AS NEEDED FOR SEVERE PAIN.    - MAY MAKE DROWSY - 42 tablet 0  . nabumetone (RELAFEN) 750 MG tablet Take 1 tablet by mouth 2 (two) times daily.    Marland Kitchen omeprazole (PRILOSEC) 20 MG capsule Take 1 capsule by mouth daily.     No current facility-administered medications on file prior to visit.     No Known Allergies  Social  History   Substance and Sexual Activity  Alcohol Use Yes   Comment: As of 07/30/18 (1) 40 ounce beer daily    Social History   Tobacco Use  Smoking Status Current Every Day Smoker  . Packs/day: 0.50  . Types: Cigarettes  Smokeless Tobacco Former Systems developer  . Types: Chew  Tobacco Comment   one pack of cigs last 3 days    Review of Systems  Constitutional: Positive for malaise/fatigue.  HENT: Negative.   Eyes: Positive for blurred vision.  Respiratory: Positive for cough.   Cardiovascular: Negative.   Gastrointestinal: Negative.   Genitourinary: Positive for frequency.  Musculoskeletal: Positive for back pain, joint pain and neck pain.  Skin: Negative.   Neurological: Negative.   Endo/Heme/Allergies: Negative.   Psychiatric/Behavioral: Negative.     Objective   Vitals:   09/12/18 0951  BP: (!) 92/52  Pulse: 75  Resp: 16  Temp: 98.7 F (37.1 C)    Physical Exam  Constitutional: He is oriented to person, place, and time. He appears well-developed and well-nourished. No distress.  HENT:  Head: Normocephalic and atraumatic.  Cardiovascular: Normal rate, regular rhythm and normal heart sounds. Exam reveals no gallop and no friction  rub.  No murmur heard. Pulmonary/Chest: Effort normal and breath sounds normal. No stridor. No respiratory distress. He has no wheezes. He has no rales.  Neurological: He is alert and oriented to person, place, and time.  Skin: Skin is warm and dry.  Vitals reviewed. Oncology notes reviewed  Assessment  Left lung carcinoma with metastatic disease, need for central venous access Plan   Patient is scheduled to undergo a Port-A-Cath insertion on 09/13/2018.  The risks and benefits of the procedure including bleeding, infection, and pneumothorax were fully explained to the patient, who gave informed consent.

## 2018-09-13 ENCOUNTER — Ambulatory Visit (HOSPITAL_COMMUNITY): Payer: Medicare HMO | Admitting: Anesthesiology

## 2018-09-13 ENCOUNTER — Encounter (HOSPITAL_COMMUNITY)
Admission: RE | Disposition: A | Discharge status: Home / Self Care | Payer: Self-pay | Source: Ambulatory Visit | Attending: General Surgery

## 2018-09-13 ENCOUNTER — Telehealth (HOSPITAL_COMMUNITY): Payer: Self-pay | Admitting: General Practice

## 2018-09-13 ENCOUNTER — Ambulatory Visit (HOSPITAL_COMMUNITY): Payer: Medicare HMO

## 2018-09-13 ENCOUNTER — Encounter (HOSPITAL_COMMUNITY): Payer: Self-pay | Admitting: Anesthesiology

## 2018-09-13 ENCOUNTER — Other Ambulatory Visit: Payer: Self-pay

## 2018-09-13 ENCOUNTER — Ambulatory Visit (HOSPITAL_COMMUNITY)
Admission: RE | Admit: 2018-09-13 | Discharge: 2018-09-13 | Disposition: A | Discharge status: Home / Self Care | Payer: Medicare HMO | Source: Ambulatory Visit | Attending: General Surgery | Admitting: General Surgery

## 2018-09-13 ENCOUNTER — Telehealth: Payer: Self-pay | Admitting: General Practice

## 2018-09-13 DIAGNOSIS — I10 Essential (primary) hypertension: Secondary | ICD-10-CM | POA: Insufficient documentation

## 2018-09-13 DIAGNOSIS — C3492 Malignant neoplasm of unspecified part of left bronchus or lung: Secondary | ICD-10-CM | POA: Insufficient documentation

## 2018-09-13 DIAGNOSIS — Z95828 Presence of other vascular implants and grafts: Secondary | ICD-10-CM

## 2018-09-13 DIAGNOSIS — N400 Enlarged prostate without lower urinary tract symptoms: Secondary | ICD-10-CM | POA: Insufficient documentation

## 2018-09-13 DIAGNOSIS — F17210 Nicotine dependence, cigarettes, uncomplicated: Secondary | ICD-10-CM | POA: Insufficient documentation

## 2018-09-13 DIAGNOSIS — C3412 Malignant neoplasm of upper lobe, left bronchus or lung: Secondary | ICD-10-CM | POA: Diagnosis not present

## 2018-09-13 DIAGNOSIS — Z79899 Other long term (current) drug therapy: Secondary | ICD-10-CM | POA: Insufficient documentation

## 2018-09-13 HISTORY — PX: PORTACATH PLACEMENT: SHX2246

## 2018-09-13 SURGERY — INSERTION, TUNNELED CENTRAL VENOUS DEVICE, WITH PORT
Anesthesia: Monitor Anesthesia Care | Site: Chest | Laterality: Right

## 2018-09-13 MED ORDER — PROPOFOL 500 MG/50ML IV EMUL
INTRAVENOUS | Status: DC | PRN
  Administered 2018-09-13: 35 ug/kg/min via INTRAVENOUS

## 2018-09-13 MED ORDER — KETOROLAC TROMETHAMINE 30 MG/ML IJ SOLN
30.0000 mg | Freq: Once | INTRAMUSCULAR | Status: AC
  Administered 2018-09-13: 30 mg via INTRAVENOUS
  Filled 2018-09-13: qty 1

## 2018-09-13 MED ORDER — FENTANYL CITRATE (PF) 100 MCG/2ML IJ SOLN
INTRAMUSCULAR | Status: AC
  Filled 2018-09-13: qty 2

## 2018-09-13 MED ORDER — HEPARIN SOD (PORK) LOCK FLUSH 100 UNIT/ML IV SOLN
INTRAVENOUS | Status: DC | PRN
  Administered 2018-09-13: 500 [IU]

## 2018-09-13 MED ORDER — HYDROMORPHONE HCL 1 MG/ML IJ SOLN
0.5000 mg | INTRAMUSCULAR | Status: DC | PRN
  Administered 2018-09-13 (×3): 0.5 mg via INTRAVENOUS
  Filled 2018-09-13: qty 0.5

## 2018-09-13 MED ORDER — MIDAZOLAM HCL 2 MG/2ML IJ SOLN
INTRAMUSCULAR | Status: AC
  Filled 2018-09-13: qty 2

## 2018-09-13 MED ORDER — MIDAZOLAM HCL 5 MG/5ML IJ SOLN
INTRAMUSCULAR | Status: DC | PRN
  Administered 2018-09-13 (×2): 1 mg via INTRAVENOUS

## 2018-09-13 MED ORDER — CHLORHEXIDINE GLUCONATE CLOTH 2 % EX PADS: 6.0000 | MEDICATED_PAD | Freq: Once | CUTANEOUS | Status: DC

## 2018-09-13 MED ORDER — SODIUM CHLORIDE 0.9 % IJ SOLN
INTRAMUSCULAR | Status: DC | PRN
  Administered 2018-09-13: 10 mL via INTRAVENOUS

## 2018-09-13 MED ORDER — HYDROMORPHONE HCL 1 MG/ML IJ SOLN
INTRAMUSCULAR | Status: AC
  Filled 2018-09-13: qty 1

## 2018-09-13 MED ORDER — LIDOCAINE HCL (PF) 1 % IJ SOLN
INTRAMUSCULAR | Status: DC | PRN
  Administered 2018-09-13: 5.5 mL

## 2018-09-13 MED ORDER — CEFAZOLIN SODIUM-DEXTROSE 2-4 GM/100ML-% IV SOLN
2.0000 g | INTRAVENOUS | Status: AC
  Administered 2018-09-13 (×2): 2 g via INTRAVENOUS
  Filled 2018-09-13: qty 100

## 2018-09-13 MED ORDER — LIDOCAINE HCL (PF) 1 % IJ SOLN
INTRAMUSCULAR | Status: AC
  Filled 2018-09-13: qty 30

## 2018-09-13 MED ORDER — HEPARIN SOD (PORK) LOCK FLUSH 100 UNIT/ML IV SOLN
INTRAVENOUS | Status: AC
  Filled 2018-09-13: qty 5

## 2018-09-13 MED ORDER — LACTATED RINGERS IV SOLN
INTRAVENOUS | Status: DC | PRN
  Administered 2018-09-13: 11:00:00 via INTRAVENOUS

## 2018-09-13 MED ORDER — FENTANYL CITRATE (PF) 100 MCG/2ML IJ SOLN
INTRAMUSCULAR | Status: DC | PRN
  Administered 2018-09-13 (×4): 25 ug via INTRAVENOUS

## 2018-09-13 SURGICAL SUPPLY — 29 items
ADH SKN CLS APL DERMABOND .7 (GAUZE/BANDAGES/DRESSINGS) ×1
BAG DECANTER FOR FLEXI CONT (MISCELLANEOUS) ×3 IMPLANT
CHLORAPREP W/TINT 10.5 ML (MISCELLANEOUS) ×3 IMPLANT
CLOTH BEACON ORANGE TIMEOUT ST (SAFETY) ×3 IMPLANT
COVER LIGHT HANDLE STERIS (MISCELLANEOUS) ×6 IMPLANT
DECANTER SPIKE VIAL GLASS SM (MISCELLANEOUS) ×3 IMPLANT
DERMABOND ADVANCED (GAUZE/BANDAGES/DRESSINGS) ×2
DERMABOND ADVANCED .7 DNX12 (GAUZE/BANDAGES/DRESSINGS) ×1 IMPLANT
DRAPE C-ARM FOLDED MOBILE STRL (DRAPES) ×3 IMPLANT
ELECT REM PT RETURN 9FT ADLT (ELECTROSURGICAL) ×3
ELECTRODE REM PT RTRN 9FT ADLT (ELECTROSURGICAL) ×1 IMPLANT
GLOVE BIOGEL PI IND STRL 7.0 (GLOVE) ×2 IMPLANT
GLOVE BIOGEL PI INDICATOR 7.0 (GLOVE) ×4
GLOVE SURG SS PI 7.5 STRL IVOR (GLOVE) ×3 IMPLANT
GOWN STRL REUS W/TWL LRG LVL3 (GOWN DISPOSABLE) ×6 IMPLANT
IV NS 500ML (IV SOLUTION) ×3
IV NS 500ML BAXH (IV SOLUTION) ×1 IMPLANT
KIT PORT POWER 8FR ISP MRI (Port) ×3 IMPLANT
KIT TURNOVER KIT A (KITS) ×3 IMPLANT
NEEDLE HYPO 25X1 1.5 SAFETY (NEEDLE) ×3 IMPLANT
PACK MINOR (CUSTOM PROCEDURE TRAY) ×3 IMPLANT
PAD ARMBOARD 7.5X6 YLW CONV (MISCELLANEOUS) ×3 IMPLANT
SET BASIN LINEN APH (SET/KITS/TRAYS/PACK) ×3 IMPLANT
SUT MNCRL AB 4-0 PS2 18 (SUTURE) ×3 IMPLANT
SUT VIC AB 3-0 SH 27 (SUTURE) ×3
SUT VIC AB 3-0 SH 27X BRD (SUTURE) ×1 IMPLANT
SYR 20CC LL (SYRINGE) ×3 IMPLANT
SYR 5ML LL (SYRINGE) ×3 IMPLANT
SYR CONTROL 10ML LL (SYRINGE) ×3 IMPLANT

## 2018-09-13 NOTE — Telephone Encounter (Signed)
Forestine Na CSW Progress Note  Sister returned call, states patient wants to return to his own apartment in [place].  Has been living w sister during treatment.  Sister wondering about options for support at home for patient - has left form for [facility] at [facility].  Also wonders about need for home health support.  CSW messaged nurse navigator w both requests and will follow up when at [facility] on 11/5.  Edwyna Shell, LCSW Clinical Social Worker Phone:  [phone]

## 2018-09-13 NOTE — Anesthesia Preprocedure Evaluation (Signed)
Anesthesia Evaluation  Patient identified by MRN, date of birth, ID band Patient awake    Reviewed: Allergy & Precautions, NPO status , Patient's Chart, lab work & pertinent test results  Airway Mallampati: II  TM Distance: >3 FB Neck ROM: Full    Dental no notable dental hx. (+) Poor Dentition   Pulmonary neg pulmonary ROS, Current Smoker,  LUL Ca for Chemo on Monday -here for a port     Pulmonary exam normal breath sounds clear to auscultation + decreased breath sounds      Cardiovascular Exercise Tolerance: Poor hypertension, Pt. on medications negative cardio ROS Normal cardiovascular examII Rhythm:Regular Rate:Normal     Neuro/Psych States uses Crack and cocaine when he has $, states none in over 3 weeks. On Pain meds for CaKnown brain mets  negative neurological ROS  negative psych ROS   GI/Hepatic Neg liver ROS, GERD  Medicated and Controlled,  Endo/Other  negative endocrine ROS  Renal/GU negative Renal ROS  negative genitourinary   Musculoskeletal negative musculoskeletal ROS (+)   Abdominal   Peds negative pediatric ROS (+)  Hematology negative hematology ROS (+) anemia ,   Anesthesia Other Findings   Reproductive/Obstetrics negative OB ROS                             Anesthesia Physical Anesthesia Plan  ASA: IV  Anesthesia Plan: MAC   Post-op Pain Management:    Induction: Intravenous  PONV Risk Score and Plan:   Airway Management Planned: Nasal Cannula and Simple Face Mask  Additional Equipment:   Intra-op Plan:   Post-operative Plan:   Informed Consent:   Dental advisory given  Plan Discussed with:   Anesthesia Plan Comments:         Anesthesia Quick Evaluation

## 2018-09-13 NOTE — Telephone Encounter (Signed)
[place] CSW Progress Note  Call from sister, requests help, did not specify nature of the need.  Called, left VM w my contact information so she can return call as needed.  Edwyna Shell, LCSW Clinical Social Worker Phone:  [phone]

## 2018-09-13 NOTE — Op Note (Signed)
Patient:  Gregory Hickman  DOB:  Sep 26, 1951  MRN:  [id number]   Preop Diagnosis: Left lung carcinoma, need for central venous access  Postop Diagnosis: Same  Procedure: Port-A-Cath insertion  Surgeon: Aviva Signs, MD  Anes: MAC  Indications: Patient is a 67 year old black male who presents with left lung carcinoma.  He needs central venous access for chemotherapy.  The risks and benefits of the procedure including bleeding, infection, and pneumothorax were fully explained to the patient, who gave informed consent.  Procedure note: The patient was placed in Trendelenburg position after the right upper chest was prepped and draped using the usual sterile technique with DuraPrep.  Surgical site confirmation was performed.  1% Xylocaine was used for local anesthesia.  An incision was made below the right clavicle.  A subcutaneous pocket was formed.  A needle was advanced into the right subclavian vein using the Seldinger technique without difficulty.  A guidewire was then advanced into the right atrium under fluoroscopic guidance.  An introducer and peel-away sheath were placed over the guidewire.  The catheter was then inserted through the peel-away sheath and the peel-away sheath was removed.  The catheter was then attached to the port and the port placed in subcutaneous pocket.  Adequate positioning was confirmed by fluoroscopy.  Good backflow blood was noted on aspiration of the port.  The port was flushed with heparin flush.  Subcutaneous layer was reapproximated using a 3-0 Vicryl interrupted suture.  Skin was closed using a 4-0 Monocryl subcuticular suture.  Dermabond was applied.  All tape and needle counts were correct at the end of the procedure.  The patient was transferred to PACU in stable condition.  A chest x-ray will be performed at that time.  Complications: None  EBL: Minimal  Specimen: None

## 2018-09-13 NOTE — Transfer of Care (Signed)
Immediate Anesthesia Transfer of Care Note  Patient: Gregory Hickman  Procedure(s) Performed: INSERTION PORT-A-CATH (Right Chest)  Patient Location: PACU  Anesthesia Type:MAC  Level of Consciousness: drowsy  Airway & Oxygen Therapy: Patient Spontanous Breathing and Patient connected to nasal cannula oxygen  Post-op Assessment: Report given to RN and Post -op Vital signs reviewed and stable  Post vital signs: Reviewed and stable  Last Vitals:  Vitals Value Taken Time  BP    Temp    Pulse    Resp    SpO2      Last Pain:  Vitals:   09/13/18 1036  TempSrc: Oral  PainSc: 7       Patients Stated Pain Goal: 5 (93/11/21 6244)  Complications: No apparent anesthesia complications

## 2018-09-13 NOTE — Discharge Instructions (Signed)
Implanted Port Insertion, Care After °This sheet gives you information about how to care for yourself after your procedure. Your health care provider may also give you more specific instructions. If you have problems or questions, contact your health care provider. °What can I expect after the procedure? °After your procedure, it is common to have: °· Discomfort at the port insertion site. °· Bruising on the skin over the port. This should improve over 3-4 days. ° °Follow these instructions at home: °Port care °· After your port is placed, you will get a manufacturer's information card. The card has information about your port. Keep this card with you at all times. °· Take care of the port as told by your health care provider. Ask your health care provider if you or a family member can get training for taking care of the port at home. A home health care nurse may also take care of the port. °· Make sure to remember what type of port you have. °Incision care °· Follow instructions from your health care provider about how to take care of your port insertion site. Make sure you: °? Wash your hands with soap and water before you change your bandage (dressing). If soap and water are not available, use hand sanitizer. °? Change your dressing as told by your health care provider. °? Leave stitches (sutures), skin glue, or adhesive strips in place. These skin closures may need to stay in place for 2 weeks or longer. If adhesive strip edges start to loosen and curl up, you may trim the loose edges. Do not remove adhesive strips completely unless your health care provider tells you to do that. °· Check your port insertion site every day for signs of infection. Check for: °? More redness, swelling, or pain. °? More fluid or blood. °? Warmth. °? Pus or a bad smell. °General instructions °· Do not take baths, swim, or use a hot tub until your health care provider approves. °· Do not lift anything that is heavier than 10 lb (4.5  kg) for a week, or as told by your health care provider. °· Ask your health care provider when it is okay to: °? Return to work or school. °? Resume usual physical activities or sports. °· Do not drive for 24 hours if you were given a medicine to help you relax (sedative). °· Take over-the-counter and prescription medicines only as told by your health care provider. °· Wear a medical alert bracelet in case of an emergency. This will tell any health care providers that you have a port. °· Keep all follow-up visits as told by your health care provider. This is important. °Contact a health care provider if: °· You cannot flush your port with saline as directed, or you cannot draw blood from the port. °· You have a fever or chills. °· You have more redness, swelling, or pain around your port insertion site. °· You have more fluid or blood coming from your port insertion site. °· Your port insertion site feels warm to the touch. °· You have pus or a bad smell coming from the port insertion site. °Get help right away if: °· You have chest pain or shortness of breath. °· You have bleeding from your port that you cannot control. °Summary °· Take care of the port as told by your health care provider. °· Change your dressing as told by your health care provider. °· Keep all follow-up visits as told by your health care provider. °  This information is not intended to replace advice given to you by your health care provider. Make sure you discuss any questions you have with your health care provider. Document Released: 08/20/2013 Document Revised: 09/20/2016 Document Reviewed: 09/20/2016 Elsevier Interactive Patient Education  2017 [place] An implanted port is a type of central line that is placed under the skin. Central lines are used to provide IV access when treatment or nutrition needs to be given through a persons veins. Implanted ports are used for long-term IV access. An implanted port  may be placed because:  You need IV medicine that would be irritating to the small veins in your hands or arms.  You need long-term IV medicines, such as antibiotics.  You need IV nutrition for a long period.  You need frequent blood draws for lab tests.  You need dialysis.  Implanted ports are usually placed in the chest area, but they can also be placed in the upper arm, the abdomen, or the leg. An implanted port has two main parts:  Reservoir. The reservoir is round and will appear as a small, raised area under your skin. The reservoir is the part where a needle is inserted to give medicines or draw blood.  Catheter. The catheter is a thin, flexible tube that extends from the reservoir. The catheter is placed into a large vein. Medicine that is inserted into the reservoir goes into the catheter and then into the vein.  How will I care for my incision site? Do not get the incision site wet. Bathe or shower as directed by your health care provider. How is my port accessed? Special steps must be taken to access the port:  Before the port is accessed, a numbing cream can be placed on the skin. This helps numb the skin over the port site.  Your health care provider uses a sterile technique to access the port. ? Your health care provider must put on a mask and sterile gloves. ? The skin over your port is cleaned carefully with an antiseptic and allowed to dry. ? The port is gently pinched between sterile gloves, and a needle is inserted into the port.  Only "non-coring" port needles should be used to access the port. Once the port is accessed, a blood return should be checked. This helps ensure that the port is in the vein and is not clogged.  If your port needs to remain accessed for a constant infusion, a clear (transparent) bandage will be placed over the needle site. The bandage and needle will need to be changed every week, or as directed by your health care provider.  Keep the  bandage covering the needle clean and dry. Do not get it wet. Follow your health care providers instructions on how to take a shower or bath while the port is accessed.  If your port does not need to stay accessed, no bandage is needed over the port.  What is flushing? Flushing helps keep the port from getting clogged. Follow your health care providers instructions on how and when to flush the port. Ports are usually flushed with saline solution or a medicine called heparin. The need for flushing will depend on how the port is used.  If the port is used for intermittent medicines or blood draws, the port will need to be flushed: ? After medicines have been given. ? After blood has been drawn. ? As part of routine maintenance.  If a constant infusion is  running, the port may not need to be flushed.  How long will my port stay implanted? The port can stay in for as long as your health care provider thinks it is needed. When it is time for the port to come out, surgery will be done to remove it. The procedure is similar to the one performed when the port was put in. When should I seek immediate medical care? When you have an implanted port, you should seek immediate medical care if:  You notice a bad smell coming from the incision site.  You have swelling, redness, or drainage at the incision site.  You have more swelling or pain at the port site or the surrounding area.  You have a fever that is not controlled with medicine.  This information is not intended to replace advice given to you by your health care provider. Make sure you discuss any questions you have with your health care provider. Document Released: 10/30/2005 Document Revised: 04/06/2016 Document Reviewed: 07/07/2013 Elsevier Interactive Patient Education  2017 [place], Care After These instructions provide you with information about caring for yourself after your procedure. Your  health care provider may also give you more specific instructions. Your treatment has been planned according to current medical practices, but problems sometimes occur. Call your health care provider if you have any problems or questions after your procedure. What can I expect after the procedure? After your procedure, it is common to:  Feel sleepy for several hours.  Feel clumsy and have poor balance for several hours.  Feel forgetful about what happened after the procedure.  Have poor judgment for several hours.  Feel nauseous or vomit.  Have a sore throat if you had a breathing tube during the procedure.  Follow these instructions at home: For at least 24 hours after the procedure:   Do not: ? Participate in activities in which you could fall or become injured. ? Drive. ? Use heavy machinery. ? Drink alcohol. ? Take sleeping pills or medicines that cause drowsiness. ? Make important decisions or sign legal documents. ? Take care of children on your own.  Rest. Eating and drinking  Follow the diet that is recommended by your health care provider.  If you vomit, drink water, juice, or soup when you can drink without vomiting.  Make sure you have little or no nausea before eating solid foods. General instructions  Have a responsible adult stay with you until you are awake and alert.  Take over-the-counter and prescription medicines only as told by your health care provider.  If you smoke, do not smoke without supervision.  Keep all follow-up visits as told by your health care provider. This is important. Contact a health care provider if:  You keep feeling nauseous or you keep vomiting.  You feel light-headed.  You develop a rash.  You have a fever. Get help right away if:  You have trouble breathing. This information is not intended to replace advice given to you by your health care provider. Make sure you discuss any questions you have with your health care  provider. Document Released: 02/20/2016 Document Revised: 06/21/2016 Document Reviewed: 02/20/2016 Elsevier Interactive Patient Education  [employer].

## 2018-09-13 NOTE — Anesthesia Postprocedure Evaluation (Signed)
Anesthesia Post Note  Patient: Gregory Hickman  Procedure(s) Performed: INSERTION PORT-A-CATH (Right Chest)  Patient location during evaluation: PACU Anesthesia Type: MAC Level of consciousness: awake and patient cooperative Pain management: pain level controlled Vital Signs Assessment: post-procedure vital signs reviewed and stable Respiratory status: spontaneous breathing Cardiovascular status: blood pressure returned to baseline Postop Assessment: no apparent nausea or vomiting Anesthetic complications: no     Last Vitals:  Vitals:   09/13/18 1300 09/13/18 1308  BP: 126/67 118/64  Pulse: 64 67  Resp: 14 16  Temp:  36.7 C  SpO2: 95% 98%    Last Pain:  Vitals:   09/13/18 1308  TempSrc: Oral  PainSc: 5                  Rashunda Passon J

## 2018-09-13 NOTE — Interval H&P Note (Signed)
History and Physical Interval Note:  09/13/2018 10:53 AM  Gregory Hickman  has presented today for surgery, with the diagnosis of left lung cancer  The various methods of treatment have been discussed with the patient and family. After consideration of risks, benefits and other options for treatment, the patient has consented to  Procedure(s): INSERTION PORT-A-CATH (Right) as a surgical intervention .  The patient's history has been reviewed, patient examined, no change in status, stable for surgery.  I have reviewed the patient's chart and labs.  Questions were answered to the patient's satisfaction.     Aviva Signs

## 2018-09-16 ENCOUNTER — Other Ambulatory Visit: Payer: Self-pay

## 2018-09-16 ENCOUNTER — Encounter (HOSPITAL_COMMUNITY): Payer: Self-pay | Admitting: General Surgery

## 2018-09-16 ENCOUNTER — Inpatient Hospital Stay (HOSPITAL_COMMUNITY): Payer: Medicare HMO

## 2018-09-16 ENCOUNTER — Encounter (HOSPITAL_COMMUNITY): Payer: Self-pay

## 2018-09-16 ENCOUNTER — Inpatient Hospital Stay (HOSPITAL_COMMUNITY): Payer: Medicare HMO | Attending: Internal Medicine | Admitting: Internal Medicine

## 2018-09-16 ENCOUNTER — Other Ambulatory Visit (HOSPITAL_COMMUNITY): Payer: Self-pay | Admitting: Internal Medicine

## 2018-09-16 VITALS — BP 128/54 | HR 60 | Temp 98.2°F | Resp 18 | Wt 101.8 lb

## 2018-09-16 DIAGNOSIS — R52 Pain, unspecified: Secondary | ICD-10-CM | POA: Diagnosis not present

## 2018-09-16 DIAGNOSIS — Z5112 Encounter for antineoplastic immunotherapy: Secondary | ICD-10-CM | POA: Diagnosis present

## 2018-09-16 DIAGNOSIS — D72819 Decreased white blood cell count, unspecified: Secondary | ICD-10-CM | POA: Diagnosis not present

## 2018-09-16 DIAGNOSIS — I10 Essential (primary) hypertension: Secondary | ICD-10-CM | POA: Diagnosis not present

## 2018-09-16 DIAGNOSIS — C3412 Malignant neoplasm of upper lobe, left bronchus or lung: Secondary | ICD-10-CM

## 2018-09-16 DIAGNOSIS — R490 Dysphonia: Secondary | ICD-10-CM | POA: Diagnosis not present

## 2018-09-16 DIAGNOSIS — Z79899 Other long term (current) drug therapy: Secondary | ICD-10-CM | POA: Insufficient documentation

## 2018-09-16 DIAGNOSIS — D696 Thrombocytopenia, unspecified: Secondary | ICD-10-CM | POA: Insufficient documentation

## 2018-09-16 DIAGNOSIS — C7931 Secondary malignant neoplasm of brain: Secondary | ICD-10-CM | POA: Diagnosis not present

## 2018-09-16 DIAGNOSIS — Z5111 Encounter for antineoplastic chemotherapy: Secondary | ICD-10-CM | POA: Insufficient documentation

## 2018-09-16 DIAGNOSIS — C7951 Secondary malignant neoplasm of bone: Secondary | ICD-10-CM

## 2018-09-16 DIAGNOSIS — Z720 Tobacco use: Secondary | ICD-10-CM | POA: Diagnosis not present

## 2018-09-16 DIAGNOSIS — C3492 Malignant neoplasm of unspecified part of left bronchus or lung: Secondary | ICD-10-CM

## 2018-09-16 LAB — CBC WITH DIFFERENTIAL/PLATELET
Abs Immature Granulocytes: 0.02 10*3/uL (ref 0.00–0.07)
BASOS ABS: 0 10*3/uL (ref 0.0–0.1)
BASOS PCT: 0 %
EOS ABS: 0.2 10*3/uL (ref 0.0–0.5)
EOS PCT: 2 %
HCT: 37.1 % — ABNORMAL LOW (ref 39.0–52.0)
Hemoglobin: 12.6 g/dL — ABNORMAL LOW (ref 13.0–17.0)
IMMATURE GRANULOCYTES: 0 %
Lymphocytes Relative: 4 %
Lymphs Abs: 0.4 10*3/uL — ABNORMAL LOW (ref 0.7–4.0)
MCH: 32.4 pg (ref 26.0–34.0)
MCHC: 34 g/dL (ref 30.0–36.0)
MCV: 95.4 fL (ref 80.0–100.0)
Monocytes Absolute: 1.1 10*3/uL — ABNORMAL HIGH (ref 0.1–1.0)
Monocytes Relative: 12 %
NEUTROS PCT: 82 %
NRBC: 0 % (ref 0.0–0.2)
Neutro Abs: 7.3 10*3/uL (ref 1.7–7.7)
PLATELETS: 180 10*3/uL (ref 150–400)
RBC: 3.89 MIL/uL — AB (ref 4.22–5.81)
RDW: 14.9 % (ref 11.5–15.5)
WBC: 9 10*3/uL (ref 4.0–10.5)

## 2018-09-16 LAB — COMPREHENSIVE METABOLIC PANEL
ALBUMIN: 3.5 g/dL (ref 3.5–5.0)
ALT: 20 U/L (ref 0–44)
ANION GAP: 9 (ref 5–15)
AST: 19 U/L (ref 15–41)
Alkaline Phosphatase: 91 U/L (ref 38–126)
BUN: 17 mg/dL (ref 8–23)
CHLORIDE: 95 mmol/L — AB (ref 98–111)
CO2: 32 mmol/L (ref 22–32)
Calcium: 9.2 mg/dL (ref 8.9–10.3)
Creatinine, Ser: 0.76 mg/dL (ref 0.61–1.24)
GFR calc Af Amer: >60 mL/min (ref >60)
GFR calc non Af Amer: >60 mL/min (ref >60)
GLUCOSE: 110 mg/dL — AB (ref 70–99)
Potassium: 3.4 mmol/L — ABNORMAL LOW (ref 3.5–5.1)
SODIUM: 136 mmol/L (ref 135–145)
TOTAL PROTEIN: 6.9 g/dL (ref 6.5–8.1)
Total Bilirubin: 0.6 mg/dL (ref 0.3–1.2)

## 2018-09-16 LAB — TSH: TSH: 0.314 u[IU]/mL — ABNORMAL LOW (ref 0.350–4.500)

## 2018-09-16 LAB — LACTATE DEHYDROGENASE: LDH: 159 U/L (ref 98–192)

## 2018-09-16 LAB — OCCULT BLOOD X 1 CARD TO LAB, STOOL: Fecal Occult Bld: NEGATIVE

## 2018-09-16 MED ORDER — MAGIC MOUTHWASH: 5.0000 mL | Freq: Four times a day (QID) | ORAL | 0 refills | Status: AC

## 2018-09-16 MED ORDER — MORPHINE SULFATE 15 MG PO TABS: ORAL_TABLET | ORAL | 0 refills | Status: DC

## 2018-09-16 MED ORDER — SODIUM CHLORIDE 0.9 % IV SOLN
368.0000 mg | Freq: Once | INTRAVENOUS | Status: AC
  Administered 2018-09-16: 370 mg via INTRAVENOUS
  Filled 2018-09-16: qty 37

## 2018-09-16 MED ORDER — MORPHINE SULFATE ER 30 MG PO TBCR: 30.0000 mg | EXTENDED_RELEASE_TABLET | Freq: Two times a day (BID) | ORAL | 0 refills | Status: DC

## 2018-09-16 MED ORDER — SODIUM CHLORIDE 0.9 % IV SOLN
200.0000 mg | Freq: Once | INTRAVENOUS | Status: AC
  Administered 2018-09-16: 200 mg via INTRAVENOUS
  Filled 2018-09-16: qty 8

## 2018-09-16 MED ORDER — HEPARIN SOD (PORK) LOCK FLUSH 100 UNIT/ML IV SOLN
500.0000 [IU] | Freq: Once | INTRAVENOUS | Status: AC | PRN
  Administered 2018-09-16: 500 [IU]
  Filled 2018-09-16: qty 5

## 2018-09-16 MED ORDER — PALONOSETRON HCL INJECTION 0.25 MG/5ML
0.2500 mg | Freq: Once | INTRAVENOUS | Status: AC
  Administered 2018-09-16: 0.25 mg via INTRAVENOUS
  Filled 2018-09-16: qty 5

## 2018-09-16 MED ORDER — SODIUM CHLORIDE 0.9% FLUSH
10.0000 mL | INTRAVENOUS | Status: DC | PRN
  Administered 2018-09-16: 10 mL
  Filled 2018-09-16: qty 10

## 2018-09-16 MED ORDER — SODIUM CHLORIDE 0.9 % IV SOLN
Freq: Once | INTRAVENOUS | Status: AC
  Administered 2018-09-16: 10:00:00 via INTRAVENOUS

## 2018-09-16 MED ORDER — ZOLEDRONIC ACID 4 MG/100ML IV SOLN
4.0000 mg | Freq: Once | INTRAVENOUS | Status: AC
  Administered 2018-09-16: 4 mg via INTRAVENOUS
  Filled 2018-09-16: qty 100

## 2018-09-16 MED ORDER — SODIUM CHLORIDE 0.9 % IV SOLN
480.0000 mg/m2 | Freq: Once | INTRAVENOUS | Status: AC
  Administered 2018-09-16: 700 mg via INTRAVENOUS
  Filled 2018-09-16: qty 20

## 2018-09-16 MED ORDER — SODIUM CHLORIDE 0.9 % IV SOLN
Freq: Once | INTRAVENOUS | Status: AC
  Administered 2018-09-16: 10:00:00 via INTRAVENOUS
  Filled 2018-09-16: qty 5

## 2018-09-16 NOTE — Progress Notes (Signed)
Diagnosis No diagnosis found.  Staging Cancer Staging No matching staging information was found for the patient.  Assessment and Plan:  1.  Stage IV NSCLC.  Patient was complaining of several month history of back pain.  He had a CT of the chest done 07/24/2018 that showed IMPRESSION: 1. Dominant spiculated left upper lobe mass with multiple enlarged mediastinal, hilar and left axillary lymph nodes, multiple additional pulmonary nodules, a probable left hepatic metastasis and possible bilateral adrenal metastases. Findings are consistent with widespread lung cancer. Tissue sampling recommended. 2. Sclerosis of the T1 vertebral body, also suspicious for metastatic disease. 3. Aortic Atherosclerosis (ICD10-I70.0) and Emphysema (ICD10-J43.9). 4. These results will be called to the ordering clinician or representative by the Radiologist Assistant, and communication documented in the PACS or zVision Dashboard.  He has a long smoking history.  He also reports alcohol use and marijuana.    Pet scan done 08/12/2018 reviewed and showed IMPRESSION: 7.4 cm hypermetabolic mass in the left upper lobe, consistent with primary lung carcinoma.  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  MRI of brain done 08/12/2018 reviewed and showed  Impression:   Multiple brain metastases scattered throughout the cerebellum and both cerebral hemispheres. Vasogenic edema most pronounced in the right temporal lobe and right frontal lobe and to a lesser extent in the left frontal lobe. Mild mass effect with right-to-left shift of 1 or 2 mm.  Total of 4 cerebellar metastases identified, the largest measuring 6 mm in the right hemisphere. Total of 8 metastases identified in the right cerebral hemisphere, the largest measuring 17 mm in the right temporal lobe. Total of 4 metastases identified  in the left hemisphere, the largest measuring 19 mm in the left frontal lobe.  There is considerable motion degradation, and small metastatic lesions may be inapparent.  Few old scattered cortical infarctions.  Pt had biopsy of left pectoral LN on 08/26/2018 and pathology returned as Adenocarcinoma.  Foundation testing and lung biomarkers requested and results showed PDL! Score of 10%.  Pt had no reported alterations of EGFR, ALK, ROS1.  No other actionable mutations identified.    Pt has advanced NSCLC.  Pt presented with extensive disease with brain mets.  He has completed RT.    Pt is seen today for evaluation prior to C1 of chemotherapy for advanced nonsquamous NSCLC based on results of Keynote trial which showed increased overall survival and increased progression free survival with Pembro dosed at 200 mg IV every 3 weeks with Carboplatin AUC 5 every 3 weeks with Alimta 500 mg/m2 every 3 weeks for 4 cycles with Alimta and Pembro maintenance.   Side effects of the medications were reviewed with the patient and he was provided written information.   Pt has undergone PAC placement.  He is here for C1.  Labs reviewed and showed WBC 9 HB 12.6 plts 180,000.  Chemistries WNl with K+ 3.4 Cr 0.76 and normal LFTs.  TSH is 0.314.  Pt will RTC in 3 weeks for evaluation prior to C2 and should notify the office if any problems such as worsening SOB, Fatigue, Headache.  Pt will receive B12 every 9 weeks as therapy proceeds.  He should continue folic acid daily.   All questions answered and patient expressed understanding of the information presented.  2.   Brain mets.  MRI of brain showed findings concerning of brain mets.  He has completed RT.  Pt  should follow-up with RT as directed.    3.  Hoarseness.  Pt reports raspy voice after RT  No thrush noted.  CXR done on 09/13/2018 after port placed showed:   1.  PowerPort catheter with tip over superior vena cava.  2. Pulmonary masses with a large dominant  mass in the left upper lung again noted. Similar findings noted on prior study. Reference is made to prior CT report 07/24/2018.  No PTX reported.  Symptoms likely due to advanced lung cancer with large LUL mass.  Will evaluate to determine if symptoms improve with beginning therapy.    4.  Pain.  Pt feels pain is improved with current regimen.  He should continue MS contin 30 mg po bid and MSIR 15 mg q 4 hrs prn for breakthrough.  Family support recommended to continue.    5.  HTN.  BP 105/50.   Follow-up with PCP.    6.  Bone mets.  Xgeva for bone strengthening every 3-4 weeks due to bone lesions noted on imaging.    25 minutes spent with more than 50% spent in counseling and coordination of care.    Interval history:  Historical data obtained from the note dated 07/30/2018:  67 year old male referred for evaluation due to abnormal scan.  He was complaining of upper back pain for several months.  He also reported weight loss, dizziness.  Patient had a CT of the chest done 07/24/2018 that showed IMPRESSION: 1. Dominant spiculated left upper lobe mass with multiple enlarged mediastinal, hilar and left axillary lymph nodes, multiple additional pulmonary nodules, a probable left hepatic metastasis and possible bilateral adrenal metastases. Findings are consistent with widespread lung cancer. Tissue sampling recommended. 2. Sclerosis of the T1 vertebral body, also suspicious for metastatic disease. 3. Aortic Atherosclerosis (ICD10-I70.0) and Emphysema (ICD10-J43.9). 4. These results will be called to the ordering clinician or representative by the Radiologist Assistant, and communication documented in the PACS or zVision Dashboard.  He has a long smoking history.  He also reports alcohol use in marijuana.    Current Status:  Pt is seen today for follow-up prior to C1 of Keytruda, Alimta and Carboplatin.  He is complaining of hoarseness that began recently.  He has completed RT. He continues  to smoke.      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 lung ([place])   08/28/2018 Initial Diagnosis    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 lung ([place])    09/04/2018 -  Chemotherapy    The patient had palonosetron (ALOXI) injection 0.25 mg, 0.25 mg, Intravenous,  Once, 1 of 4 cycles PEMEtrexed (ALIMTA) 725 mg in sodium chloride 0.9 % 100 mL chemo infusion, 500 mg/m2 = 725 mg, Intravenous,  Once, 1 of 6 cycles CARBOplatin (PARAPLATIN) 370 mg in sodium chloride 0.9 % 100 mL chemo infusion, 370 mg (100 % of original dose 368 mg), Intravenous,  Once, 1 of 4 cycles Dose modification:   (original dose 368 mg, Cycle 1) pembrolizumab (KEYTRUDA) 200 mg in sodium chloride 0.9 % 50 mL chemo infusion, 200 mg, Intravenous, Once, 1 of 6 cycles fosaprepitant (EMEND) 150 mg, dexamethasone (DECADRON) 12 mg in sodium chloride 0.9 % 145 mL IVPB, , Intravenous,  Once, 1 of 4 cycles  for chemotherapy treatment.      Metastasis ([place])   09/02/2018 Initial Diagnosis    Metastasis ([place])    09/04/2018 -  Chemotherapy    The patient had palonosetron (ALOXI) injection 0.25 mg, 0.25 mg, Intravenous,  Once, 1 of 4 cycles PEMEtrexed (ALIMTA) 725 mg in sodium chloride 0.9 % 100 mL chemo infusion, 500 mg/m2 = 725 mg, Intravenous,  Once, 1 of 6 cycles CARBOplatin (PARAPLATIN) 370 mg in sodium chloride 0.9 % 100 mL chemo infusion, 370 mg (100 % of original dose 368 mg), Intravenous,  Once, 1 of 4 cycles Dose modification:   (original dose 368 mg, Cycle 1) pembrolizumab (KEYTRUDA) 200 mg in sodium chloride 0.9 % 50 mL chemo infusion, 200 mg, Intravenous, Once, 1 of 6 cycles fosaprepitant (EMEND) 150 mg, dexamethasone (DECADRON) 12 mg in sodium chloride 0.9 % 145 mL IVPB, , Intravenous,  Once, 1 of 4 cycles  for chemotherapy treatment.       Problem List Patient Active Problem List   Diagnosis Date Noted  . 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 Metastasis ([place]) [C79.9] 09/02/2018   . 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 lung ([place]) [C34.12] 08/28/2018  . Anemia [D64.9] 04/09/2018  . Dark stools [R19.5] 04/09/2018  . RECTAL BLEEDING [K62.5] 05/31/2010  . ANOREXIA [R63.0] 05/31/2010  . WEIGHT LOSS, RECENT [R63.4] 05/31/2010    Past Medical History Past Medical History:  Diagnosis Date  . BPH (benign prostatic hyperplasia)   . Fracture   . Hypertension   . Metastasis ([place]) 09/02/2018    Past Surgical History Past Surgical History:  Procedure Laterality Date  . APPENDECTOMY    . EYE SURGERY  1975  . HAND SURGERY    . HERNIA REPAIR  2015  . PORTACATH PLACEMENT Right 09/13/2018   Procedure: INSERTION PORT-A-CATH;  Surgeon: Aviva Signs, MD;  Location: [facility];  Service: General;  Laterality: Right;    Family History Family History  Problem Relation Age of Onset  . Colon cancer Neg Hx      Social History  reports that he has been smoking cigarettes. He has been smoking about 0.50 packs per day. He has quit using smokeless tobacco.  His smokeless tobacco use included chew. He reports that he drinks alcohol. He reports that he has current or past drug history. Drugs: Marijuana and "Crack" cocaine.  Medications  Current Outpatient Medications:  .  amLODipine (NORVASC) 10 MG tablet, Take 1 tablet by mouth daily., Disp: , Rfl:  .  atorvastatin (LIPITOR) 40 MG tablet, Take 1 tablet by mouth at bedtime. , Disp: , Rfl:  .  CARBOPLATIN IV, Inject into the vein every 21 ( twenty-one) days., Disp: , Rfl:  .  dexamethasone (DECADRON) 4 MG tablet, Take 1 tablet (4 mg total) by mouth 2 (two) times daily., Disp: 60 tablet, Rfl: 1 .  dexamethasone (DECADRON) 4 MG tablet, Take 1 tab two times a day the day before Alimta chemo, then take 2 tabs once a day for 3 days starting the day after chemo., Disp: 30 tablet, Rfl: 1 .  dutasteride (AVODART) 0.5 MG capsule, Take 1 capsule by mouth daily., Disp: , Rfl:  .  ferrous sulfate 325 (65 FE) MG tablet, Take 325 mg by mouth 2  (two) times daily with a meal., Disp: , Rfl:  .  folic acid (FOLVITE) 1 MG tablet, Take 1 tablet (1 mg total) by mouth daily., Disp: 30 tablet, Rfl: 5 .  lidocaine-prilocaine (EMLA) cream, Apply small amount to port site and cover with plastic wrap one hour prior to appointment., Disp: 30 g, Rfl: 3 .  lisinopril-hydrochlorothiazide (PRINZIDE,ZESTORETIC) 20-12.5 MG tablet, Take 1 tablet by mouth 2 (two) times daily. ,  Disp: , Rfl:  .  magic mouthwash SOLN, Take 5 mLs by mouth 4 (four) times daily. Swish and spit, Disp: 240 mL, Rfl: 0 .  morphine (MS CONTIN) 30 MG 12 hr tablet, TAKE 1 TABLET BY MOUTH EVERY 12 HOURS, Disp: 14 tablet, Rfl: 0 .  morphine (MSIR) 15 MG tablet, TAKE  (1)  TABLET  EVERY FOUR HOURS AS NEEDED FOR SEVERE PAIN.    - MAY MAKE DROWSY -, Disp: 42 tablet, Rfl: 0 .  nabumetone (RELAFEN) 750 MG tablet, Take 1 tablet by mouth 2 (two) times daily., Disp: , Rfl:  .  omeprazole (PRILOSEC) 20 MG capsule, Take 1 capsule by mouth daily., Disp: , Rfl:  .  Pembrolizumab (KEYTRUDA IV), Inject into the vein every 21 ( twenty-one) days., Disp: , Rfl:  .  PEMEtrexed Disodium (ALIMTA IV), Inject into the vein every 21 ( twenty-one) days., Disp: , Rfl:  .  prochlorperazine (COMPAZINE) 10 MG tablet, Take 1 tablet (10 mg total) by mouth every 6 (six) hours as needed (Nausea or vomiting)., Disp: 30 tablet, Rfl: 1 No current facility-administered medications for this visit.   Facility-Administered Medications Ordered in Other Visits:  .  CARBOplatin (PARAPLATIN) 370 mg in sodium chloride 0.9 % 250 mL chemo infusion, 370 mg, Intravenous, Once, Hajer Dwyer, MD, Last Rate: 574 mL/hr at 09/16/18 1237, 370 mg at 09/16/18 1237 .  heparin lock flush 100 unit/mL, 500 Units, Intracatheter, Once PRN, Shena Vinluan, MD .  sodium chloride flush (NS) 0.9 % injection 10 mL, 10 mL, Intracatheter, PRN, Hendel Gatliff, MD, 10 mL at 09/16/18 0945 .  Zoledronic Acid (ZOMETA) IVPB 4 mg, 4 mg, Intravenous, Once, Naviyah Schaffert,  Mathis Dad, MD  Allergies Patient has no known allergies.  Review of Systems Review of Systems - Oncology ROS negative other than hoarseness.     Physical Exam  Vitals Wt Readings from Last 3 Encounters:  09/16/18 101 lb 12.8 oz (46.2 kg)  09/12/18 103 lb 9.6 oz (47 kg)  08/30/18 107 lb 3.2 oz (48.6 kg)   Temp Readings from Last 3 Encounters:  09/16/18 (!) 97.3 F (36.3 C) (Oral)  09/13/18 98.1 F (36.7 C) (Oral)  09/12/18 98.7 F (37.1 C) (Temporal)   BP Readings from Last 3 Encounters:  09/16/18 (!) 105/50  09/13/18 118/64  09/12/18 (!) 92/52   Pulse Readings from Last 3 Encounters:  09/16/18 71  09/13/18 67  09/12/18 75   Constitutional: Well-developed, well-nourished, and in no distress.   HENT: Head: Normocephalic and atraumatic.  Mouth/Throat: No oropharyngeal exudate. Mucosa moist. Eyes: Pupils are equal, round, and reactive to light. Conjunctivae are normal. No scleral icterus.  Neck: Normal range of motion. Neck supple. No JVD present.  Cardiovascular: Normal rate, regular rhythm and normal heart sounds.  Exam reveals no gallop and no friction rub.   No murmur heard. Pulmonary/Chest: Effort normal and breath sounds normal. No respiratory distress. No wheezes.No rales.   Abdominal: Soft. Bowel sounds are normal. No distension. There is no tenderness. There is no guarding.  Musculoskeletal: No edema or tenderness.  Lymphadenopathy: No cervical, axillary or supraclavicular adenopathy.  Neurological: Alert and oriented to person, place, and time. No cranial nerve deficit.  Skin: Skin is warm and dry. No rash noted. No erythema. No pallor.  Psychiatric: Affect and judgment normal.   Labs Infusion on 09/16/2018  Component Date Value Ref Range Status  . Fecal Occult Bld 09/16/2018 NEGATIVE  NEGATIVE Final   Performed at [facility], [address]  [place], [place] [address]  . TSH 09/16/2018 0.314* 0.350 - 4.500 uIU/mL Final   Comment: Performed by a 3rd  Generation assay with a functional sensitivity of <=0.01 uIU/mL. Performed at [facility], [address], [place], [place] [address]   Appointment on 09/16/2018  Component Date Value Ref Range Status  . WBC 09/16/2018 9.0  4.0 - 10.5 K/uL Final   WHITE COUNT CONFIRMED ON SMEAR  . RBC 09/16/2018 3.89* 4.22 - 5.81 MIL/uL Final  . Hemoglobin 09/16/2018 12.6* 13.0 - 17.0 g/dL Final  . HCT 09/16/2018 37.1* 39.0 - 52.0 % Final  . MCV 09/16/2018 95.4  80.0 - 100.0 fL Final  . MCH 09/16/2018 32.4  26.0 - 34.0 pg Final  . MCHC 09/16/2018 34.0  30.0 - 36.0 g/dL Final  . RDW 09/16/2018 14.9  11.5 - 15.5 % Final  . Platelets 09/16/2018 180  150 - 400 K/uL Final  . nRBC 09/16/2018 0.0  0.0 - 0.2 % Final  . Neutrophils Relative % 09/16/2018 82  % Final  . Neutro Abs 09/16/2018 7.3  1.7 - 7.7 K/uL Final  . Lymphocytes Relative 09/16/2018 4  % Final  . Lymphs Abs 09/16/2018 0.4* 0.7 - 4.0 K/uL Final  . Monocytes Relative 09/16/2018 12  % Final  . Monocytes Absolute 09/16/2018 1.1* 0.1 - 1.0 K/uL Final  . Eosinophils Relative 09/16/2018 2  % Final  . Eosinophils Absolute 09/16/2018 0.2  0.0 - 0.5 K/uL Final  . Basophils Relative 09/16/2018 0  % Final  . Basophils Absolute 09/16/2018 0.0  0.0 - 0.1 K/uL Final  . Immature Granulocytes 09/16/2018 0  % Final  . Abs Immature Granulocytes 09/16/2018 0.02  0.00 - 0.07 K/uL Final   Performed at [facility], [address] [place], [place] [address]  . Sodium 09/16/2018 136  135 - 145 mmol/L Final  . Potassium 09/16/2018 3.4* 3.5 - 5.1 mmol/L Final  . Chloride 09/16/2018 95* 98 - 111 mmol/L Final  . CO2 09/16/2018 32  22 - 32 mmol/L Final  . Glucose, Bld 09/16/2018 110* 70 - 99 mg/dL Final  . BUN 09/16/2018 17  8 - 23 mg/dL Final  . Creatinine, Ser 09/16/2018 0.76  0.61 - 1.24 mg/dL Final  . Calcium 09/16/2018 9.2  8.9 - 10.3 mg/dL Final  . Total Protein 09/16/2018 6.9  6.5 - 8.1 g/dL Final  . Albumin 09/16/2018 3.5  3.5 - 5.0 g/dL Final  . AST  09/16/2018 19  15 - 41 U/L Final  . ALT 09/16/2018 20  0 - 44 U/L Final  . Alkaline Phosphatase 09/16/2018 91  38 - 126 U/L Final  . Total Bilirubin 09/16/2018 0.6  0.3 - 1.2 mg/dL Final  . GFR calc non Af Amer 09/16/2018 >60  >60 mL/min Final  . GFR calc Af Amer 09/16/2018 >60  >60 mL/min Final   Comment: (NOTE) The eGFR has been calculated using the CKD EPI equation. This calculation has not been validated in all clinical situations. eGFR's persistently <60 mL/min signify possible Chronic Kidney Disease.   Georgiann Hahn gap 09/16/2018 9  5 - 15 Final   Performed at [facility], [address] [place], [place] [address]  . LDH 09/16/2018 159  98 - 192 U/L Final   Performed at [facility], [address], [place],  [address]     Pathology No orders of the defined types were placed in this encounter.      Zoila Shutter MD

## 2018-09-16 NOTE — Patient Instructions (Signed)
[facility] Discharge Instructions for Patients Receiving Chemotherapy   Beginning January 23rd 2017 lab work for the [facility] will be done in the  Main lab at [facility] on 1st floor. If you have a lab appointment with the [facility] please come in thru the  Main Entrance and check in at the main information desk   Today you received the following chemotherapy agents Keytruda,Alimta and Carboplatin. Follow-up as scheduled. Call clinic for any questions or concerns  To help prevent nausea and vomiting after your treatment, we encourage you to take your nausea medication   If you develop nausea and vomiting, or diarrhea that is not controlled by your medication, call the clinic.  The clinic phone number is [phone]. Office hours are Monday-Friday 8:30am-5:00pm.  BELOW ARE SYMPTOMS THAT SHOULD BE REPORTED IMMEDIATELY:  *FEVER GREATER THAN 101.0 F  *CHILLS WITH OR WITHOUT FEVER  NAUSEA AND VOMITING THAT IS NOT CONTROLLED WITH YOUR NAUSEA MEDICATION  *UNUSUAL SHORTNESS OF BREATH  *UNUSUAL BRUISING OR BLEEDING  TENDERNESS IN MOUTH AND THROAT WITH OR WITHOUT PRESENCE OF ULCERS  *URINARY PROBLEMS  *BOWEL PROBLEMS  UNUSUAL RASH Items with * indicate a potential emergency and should be followed up as soon as possible. If you have an emergency after office hours please contact your primary care physician or go to the nearest emergency department.  Please call the clinic during office hours if you have any questions or concerns.   You may also contact the Patient Navigator at [phone] should you have any questions or need assistance in obtaining follow up care.      Resources For Cancer Patients and their Caregivers ? American Cancer Society: Can assist with transportation, wigs, general needs, runs Look Good Feel Better.        862-781-9531 ? Cancer Care: Provides financial assistance, online support groups, medication/co-pay assistance.   1-800-813-HOPE (229)491-8496) ? [facility] Assists [place] cancer patients and their families through emotional , educational and financial support.  928-667-3641 ? [place] DSS Where to apply for food stamps, Medicaid and utility assistance. 952-017-3030 ? [facility]: Transportation to medical appointments. 418-315-8635 ? Social Security Administration: May apply for disability if have a Stage IV cancer. 714-583-8500 [phone])541-3759 ? [employer], Disability and Transit Services: Assists with nutrition, care and transit needs. [phone]

## 2018-09-16 NOTE — Progress Notes (Signed)
[id number] Labs reviewed with and pt seen by Dr. Walden Field today and pt approved for chemo tx per MD. CXR results reviewed prior to accessing portacath with goo blood return noted                     Gregory Hickman tolerated chemo tx well without complaints or incident. Chemotherapy education packet with drug specific information  and Zometa written information given to pt and family and reviewed in detail.All questions were answered and permits obtained.Calcium 9.2 today and pt instructed to start on Calcium 1200 mg PO daily and to report any tooth or jaw pain and any future dental visits with understanding verbalized VSS upon discharge. Pt discharged via wheelchair in satisfactory condition accompanied by family members

## 2018-09-17 ENCOUNTER — Telehealth (HOSPITAL_COMMUNITY): Payer: Self-pay

## 2018-09-17 NOTE — Telephone Encounter (Signed)
41 Spoke with pt's sister who the pt is staying with at this time. She reports that he is doing fine. He does still c/o hoarseness from recent radiation treatments but otherwise he denies any complaints. She was encouraged to call for any problems or questions and verbalized understanding

## 2018-09-19 NOTE — Progress Notes (Signed)
  Radiation Oncology         [id number]) [phone] ________________________________  Name: Gregory Hickman MRN: [id number]  Date: 09/10/2018  DOB: [birth year]  End of Treatment Note  Diagnosis:   67 y.o. male with metastatic stage IV lung cancer     Indication for treatment:  Palliative       Radiation treatment dates:   08/28/2018 - 09/10/2018  Site/dose:    1. Whole Brain / 30 Gy in 10 fractions 2. Left Lung / 30 Gy in 10 fractions 3. Cervical Spine (C7) and Right Scapula / 30 Gy in 10 fractions   Beams/energy:    1. Isodose Plan / 6X Photon 2. 3D / 10X, 15X, 6X Photon 3. Isodose Plan / 6X, 10X Photon  Narrative: The patient tolerated radiation treatment very well.  No significant difficulties or acute effects.  Plan: The patient has completed radiation treatment. The patient will return to radiation oncology clinic for routine followup in one month. I advised them to call or return sooner if they have any questions or concerns related to their recovery or treatment.  ------------------------------------------------  Jodelle Gross, MD, PhD  This document serves as a record of services personally performed by Kyung Rudd, MD. It was created on his behalf by Rae Lips, a trained medical scribe. The creation of this record is based on the scribe's personal observations and the provider's statements to them. This document has been checked and approved by the attending provider.

## 2018-09-23 ENCOUNTER — Other Ambulatory Visit (HOSPITAL_COMMUNITY): Payer: Self-pay | Admitting: Internal Medicine

## 2018-09-23 ENCOUNTER — Other Ambulatory Visit (HOSPITAL_COMMUNITY): Payer: Self-pay | Admitting: *Deleted

## 2018-09-23 DIAGNOSIS — C3492 Malignant neoplasm of unspecified part of left bronchus or lung: Secondary | ICD-10-CM

## 2018-09-23 NOTE — Progress Notes (Signed)
We received notification from patient's sister that she needed additional help at home with patient. She states that he is continuing to do illegal drugs and that he is not compliant with his treatments at home.   I spoke with Dr. Walden Field and she is okay with [place] getting patient some additional help in the home. Home health referral will be placed.  She also wants [place] to do a random urine drug screen when patient returns to clinic. Orders have been placed

## 2018-09-24 ENCOUNTER — Telehealth: Payer: Self-pay | Admitting: General Practice

## 2018-09-24 ENCOUNTER — Telehealth (HOSPITAL_COMMUNITY): Payer: Self-pay | Admitting: Surgery

## 2018-09-24 NOTE — Telephone Encounter (Signed)
[facility] CSW Progress Notes  Call from sister, CSW left VM w information on status of Medicaid [place] request.  Per [facility], they have received a correctly completed form, have processed it, and will be calling to schedule the in home assessment soon.  Encouraged sister to call back w any further needs for information/resources.  Edwyna Shell, LCSW Clinical Social Worker Phone:  [phone]

## 2018-09-24 NOTE — Telephone Encounter (Signed)
Kathlee Nations from [facility] called concerning Gregory Hickman.  Kathlee Nations stated that his blood pressure was 82/50 today, and that the pt had already taken his amlodipine (10 mg daily) and his first dose of Lisinopril/hctz (20-12.5 mg BID).  Notified Dr. Walden Field, who ordered for the pt to hold his Lisinopril/hctz for tonight.  Left message for Kathlee Nations with these instructions and told her to call back if she had any questions or concerns.

## 2018-09-24 NOTE — Progress Notes (Signed)
  Radiation Oncology         [phone] ________________________________  Name: Gregory Hickman MRN: [id number]  Date: 08/23/2018  DOB: Sep 23, 1951  SIMULATION AND TREATMENT PLANNING NOTE  DIAGNOSIS:     ICD-10-CM   1. Primary malignant neoplasm of lung with metastasis to brain ([place]) C34.90 oxyCODONE-acetaminophen (PERCOCET/ROXICET) 5-325 MG per tablet 1 tablet   C79.31   2. 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 lung (HCC) C34.12      Site:   1.  Whole brain radiation treatment 2.  Left lung 3.  C-spine/scapula  NARRATIVE:  The patient was brought to the [facility].  Identity was confirmed.  All relevant records and images related to the planned course of therapy were reviewed.   Written consent to proceed with treatment was confirmed which was freely given after reviewing the details related to the planned course of therapy had been reviewed with the patient.  Then, the patient was set-up in a stable reproducible  supine position for radiation therapy.  CT images were obtained.  Surface markings were placed.    Medically necessary complex treatment device(s) for immobilization:   1.  S frame 2.  Accuform device  The CT images were loaded into the planning software.  Then the target and avoidance structures were contoured.  Treatment planning then occurred.  The radiation prescription was entered and confirmed.  A total of 10 complex treatment devices were fabricated which relate to the designed radiation treatment fields. Each of these customized fields/ complex treatment devices will be used on a daily basis during the radiation course. I have requested : 3D Simulation  I have requested a DVH of the following structures: Target volume, spinal cord, lungs.   The patient will undergo daily image guidance to ensure accurate localization of the target, and adequate minimize dose to the normal surrounding structures in close proximity to the target.   PLAN:  The  patient will receive 30 Gy in 10 fractions to each of the 3 target regions above.  ________________________________   Jodelle Gross, MD, PhD

## 2018-09-27 ENCOUNTER — Inpatient Hospital Stay (HOSPITAL_COMMUNITY): Payer: Medicare HMO

## 2018-09-27 ENCOUNTER — Inpatient Hospital Stay (HOSPITAL_BASED_OUTPATIENT_CLINIC_OR_DEPARTMENT_OTHER): Payer: Medicare HMO | Admitting: Internal Medicine

## 2018-09-27 ENCOUNTER — Other Ambulatory Visit: Payer: Self-pay

## 2018-09-27 ENCOUNTER — Other Ambulatory Visit (HOSPITAL_COMMUNITY): Payer: Self-pay | Admitting: Internal Medicine

## 2018-09-27 VITALS — BP 95/56 | HR 70 | Temp 98.4°F | Resp 18 | Wt 99.2 lb

## 2018-09-27 VITALS — BP 104/57 | HR 63 | Temp 98.9°F | Resp 16

## 2018-09-27 DIAGNOSIS — C7951 Secondary malignant neoplasm of bone: Secondary | ICD-10-CM

## 2018-09-27 DIAGNOSIS — C3412 Malignant neoplasm of upper lobe, left bronchus or lung: Secondary | ICD-10-CM | POA: Diagnosis not present

## 2018-09-27 DIAGNOSIS — Z5112 Encounter for antineoplastic immunotherapy: Secondary | ICD-10-CM | POA: Diagnosis not present

## 2018-09-27 DIAGNOSIS — R042 Hemoptysis: Secondary | ICD-10-CM

## 2018-09-27 DIAGNOSIS — Z720 Tobacco use: Secondary | ICD-10-CM

## 2018-09-27 DIAGNOSIS — C7931 Secondary malignant neoplasm of brain: Secondary | ICD-10-CM

## 2018-09-27 DIAGNOSIS — C3492 Malignant neoplasm of unspecified part of left bronchus or lung: Secondary | ICD-10-CM

## 2018-09-27 DIAGNOSIS — I10 Essential (primary) hypertension: Secondary | ICD-10-CM

## 2018-09-27 DIAGNOSIS — D696 Thrombocytopenia, unspecified: Secondary | ICD-10-CM | POA: Diagnosis not present

## 2018-09-27 DIAGNOSIS — D72819 Decreased white blood cell count, unspecified: Secondary | ICD-10-CM

## 2018-09-27 LAB — CBC WITH DIFFERENTIAL/PLATELET
Abs Immature Granulocytes: 0.01 10*3/uL (ref 0.00–0.07)
BASOS ABS: 0 10*3/uL (ref 0.0–0.1)
Basophils Relative: 0 %
EOS PCT: 1 %
Eosinophils Absolute: 0 10*3/uL (ref 0.0–0.5)
HEMATOCRIT: 30.7 % — AB (ref 39.0–52.0)
HEMOGLOBIN: 10.2 g/dL — AB (ref 13.0–17.0)
Immature Granulocytes: 1 %
LYMPHS PCT: 21 %
Lymphs Abs: 0.3 10*3/uL — ABNORMAL LOW (ref 0.7–4.0)
MCH: 31.6 pg (ref 26.0–34.0)
MCHC: 33.2 g/dL (ref 30.0–36.0)
MCV: 95 fL (ref 80.0–100.0)
Monocytes Absolute: 0.3 10*3/uL (ref 0.1–1.0)
Monocytes Relative: 20 %
NRBC: 0 % (ref 0.0–0.2)
Neutro Abs: 0.8 10*3/uL — ABNORMAL LOW (ref 1.7–7.7)
Neutrophils Relative %: 57 %
Platelets: 19 10*3/uL — CL (ref 150–400)
RBC: 3.23 MIL/uL — ABNORMAL LOW (ref 4.22–5.81)
RDW: 14 % (ref 11.5–15.5)
WBC: 1.4 10*3/uL — CL (ref 4.0–10.5)

## 2018-09-27 LAB — FERRITIN: Ferritin: 236 ng/mL (ref 24–336)

## 2018-09-27 LAB — ABO/RH: ABO/RH(D): A NEG

## 2018-09-27 MED ORDER — DIPHENHYDRAMINE HCL 25 MG PO CAPS
25.0000 mg | ORAL_CAPSULE | Freq: Once | ORAL | Status: AC
  Administered 2018-09-27: 25 mg via ORAL
  Filled 2018-09-27: qty 1

## 2018-09-27 MED ORDER — SODIUM CHLORIDE 0.9% IV SOLUTION
250.0000 mL | Freq: Once | INTRAVENOUS | Status: AC
  Administered 2018-09-27: 250 mL via INTRAVENOUS

## 2018-09-27 MED ORDER — ACETAMINOPHEN 325 MG PO TABS
650.0000 mg | ORAL_TABLET | Freq: Once | ORAL | Status: AC
  Administered 2018-09-27: 650 mg via ORAL
  Filled 2018-09-27: qty 2

## 2018-09-27 MED ORDER — SODIUM CHLORIDE 0.9% FLUSH: 10.0000 mL | INTRAVENOUS | Status: AC | PRN

## 2018-09-27 MED ORDER — LEVOFLOXACIN 500 MG PO TABS: 500.0000 mg | ORAL_TABLET | Freq: Every day | ORAL | 0 refills | Status: AC

## 2018-09-27 NOTE — Progress Notes (Signed)
Diagnosis Non-small cell cancer of left lung (Gregory Hickman) - Plan: CBC with Differential/Platelet, Ferritin  Thrombocytopenia (HCC) - Plan: CBC with Differential/Platelet  Staging Cancer Staging No matching staging information was found for the patient.  Assessment and Plan:   1.  Stage IV NSCLC.  Patient was complaining of several month history of back pain.  He had a CT of the chest done 07/24/2018 that showed IMPRESSION: 1. Dominant spiculated left upper lobe mass with multiple enlarged mediastinal, hilar and left axillary lymph nodes, multiple additional pulmonary nodules, a probable left hepatic metastasis and possible bilateral adrenal metastases. Findings are consistent with widespread lung cancer. Tissue sampling recommended. 2. Sclerosis of the T1 vertebral body, also suspicious for metastatic disease. 3. Aortic Atherosclerosis (ICD10-I70.0) and Emphysema (ICD10-J43.9). 4. These results will be called to the ordering clinician or representative by the Radiologist Assistant, and communication documented in the PACS or zVision Dashboard.  He has a long smoking history.  He also reports alcohol use and marijuana.    Pet scan done 08/12/2018 reviewed and showed IMPRESSION: 7.4 cm hypermetabolic mass in the left upper lobe, consistent with primary lung carcinoma.  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  MRI of brain done 08/12/2018 reviewed and showed  Impression:   Multiple brain metastases scattered throughout the cerebellum and both cerebral hemispheres. Vasogenic edema most pronounced in the right temporal lobe and right frontal lobe and to a lesser extent in the left frontal lobe. Mild mass effect with right-to-left shift of 1 or 2 mm.  Total of 4 cerebellar metastases identified, the largest measuring 6 mm in the right hemisphere. Total of 8 metastases  identified in the right cerebral hemisphere, the largest measuring 17 mm in the right temporal lobe. Total of 4 metastases identified in the left hemisphere, the largest measuring 19 mm in the left frontal lobe.  There is considerable motion degradation, and small metastatic lesions may be inapparent.  Few old scattered cortical infarctions.  Pt had biopsy of left pectoral LN on 08/26/2018 and pathology returned as Adenocarcinoma.  Foundation testing and lung biomarkers requested and results showed PDL1 Score of 10%.  Pt had no reported alterations of EGFR, ALK, ROS1.  No other actionable mutations identified.    Pt has advanced NSCLC.  Pt presented with extensive disease with brain mets.  He has completed RT.    Pt has completed C1 of Pembro dosed at 200 mg IV every 3 weeks with Carboplatin AUC 5 every 3 weeks with Alimta 500 mg/m2 every 3 weeks for 4 cycles with Alimta and Pembro maintenance.   Pt will continue B12 every 9 weeks as therapy proceeds.  He should continue folic acid daily.  I discussed with her Decadron is premed first 3 days of each cycle.    Labs done 09/27/2018 reviewed and showed WBC 1.4 HB 10.2 plts 19, 000.  Ferritin 236.  Pt will be transfused with 1 u pheresis plts.  He will have repeat labs in 1 week.    2.  Thrombocytopenia.  Plt count is 19,000.  Pt is transfused with 1 U Pheresis plts.  He will have repeat labs in 1 week.  Likely nadir from recent chemotherapy.   3.  Leukopenia.  Pt afebrile.  ANC 800.   Likely nadir from recent chemotherapy.  Will repeat labs in 1 week.  Levaquin Rxd.    4  Blood tinged sputum.  Sister brings  in cup with frothy sputum and few pink areas. He will be placed on Levaquin 500 mg po daily for 7 days.  He is afebrile in clinic and symptoms likely due to thrombocytopenia.    5.   Brain mets.  MRI of brain showed findings concerning of brain mets.  He has completed RT.  Pt should follow-up with RT as directed.  Pt has been advised about  steroid taper per RT.    6.  HTN.  BP is 95/56.  IVF given in clinic with plts.  Pt advised not to take BP meds.    7.  Pain.  Pt feels pain is improved with current regimen.  He should continue MS contin 30 mg po bid and MSIR 15 mg q 4 hrs prn for breakthrough.  Family support recommended to continue.    8.  Bone mets.  Xgeva for bone strengthening every 3-4 weeks due to bone lesions noted on imaging.    25 minutes spent with more than 50% spent in counseling and coordination of care.    Interval history:  Historical data obtained from the note dated 07/30/2018:  67 year old male referred for evaluation due to abnormal scan.  He was complaining of upper back pain for several months.  He also reported weight loss, dizziness.  Patient had a CT of the chest done 07/24/2018 that showed IMPRESSION: 1. Dominant spiculated left upper lobe mass with multiple enlarged mediastinal, hilar and left axillary lymph nodes, multiple additional pulmonary nodules, a probable left hepatic metastasis and possible bilateral adrenal metastases. Findings are consistent with widespread lung cancer. Tissue sampling recommended. 2. Sclerosis of the T1 vertebral body, also suspicious for metastatic disease. 3. Aortic Atherosclerosis (ICD10-I70.0) and Emphysema (ICD10-J43.9). 4. These results will be called to the ordering clinician or representative by the Radiologist Assistant, and communication documented in the PACS or zVision Dashboard.  He has a long smoking history.  He also reports alcohol use in marijuana.    Current Status:  Pt is seen today for follow-up after C1 of Keytruda, Alimta and Carboplatin.  Sister reports some occasional blood tinged sputum.  He continues to smoke.      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 lung ([place])   08/28/2018 Initial Diagnosis    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 lung ([place])    09/04/2018 -  Chemotherapy    The patient had palonosetron (ALOXI) injection 0.25  mg, 0.25 mg, Intravenous,  Once, 1 of 4 cycles Administration: 0.25 mg (09/16/2018) PEMEtrexed (ALIMTA) 700 mg in sodium chloride 0.9 % 100 mL chemo infusion, 480 mg/m2 = 725 mg, Intravenous,  Once, 1 of 6 cycles Administration: 700 mg (09/16/2018) CARBOplatin (PARAPLATIN) 370 mg in sodium chloride 0.9 % 250 mL chemo infusion, 370 mg (100 % of original dose 368 mg), Intravenous,  Once, 1 of 4 cycles Dose modification:   (original dose 368 mg, Cycle 1) Administration: 370 mg (09/16/2018) pembrolizumab (KEYTRUDA) 200 mg in sodium chloride 0.9 % 50 mL chemo infusion, 200 mg, Intravenous, Once, 1 of 6 cycles Administration: 200 mg (09/16/2018) fosaprepitant (EMEND) 150 mg, dexamethasone (DECADRON) 12 mg in sodium chloride 0.9 % 145 mL IVPB, , Intravenous,  Once, 1 of 4 cycles Administration:  (09/16/2018)  for chemotherapy treatment.      Metastasis ([place])   09/02/2018 Initial Diagnosis    Metastasis ([place])    09/04/2018 -  Chemotherapy    The patient had palonosetron (ALOXI) injection 0.25 mg, 0.25 mg, Intravenous,  Once, 1  of 4 cycles Administration: 0.25 mg (09/16/2018) PEMEtrexed (ALIMTA) 700 mg in sodium chloride 0.9 % 100 mL chemo infusion, 480 mg/m2 = 725 mg, Intravenous,  Once, 1 of 6 cycles Administration: 700 mg (09/16/2018) CARBOplatin (PARAPLATIN) 370 mg in sodium chloride 0.9 % 250 mL chemo infusion, 370 mg (100 % of original dose 368 mg), Intravenous,  Once, 1 of 4 cycles Dose modification:   (original dose 368 mg, Cycle 1) Administration: 370 mg (09/16/2018) pembrolizumab (KEYTRUDA) 200 mg in sodium chloride 0.9 % 50 mL chemo infusion, 200 mg, Intravenous, Once, 1 of 6 cycles Administration: 200 mg (09/16/2018) fosaprepitant (EMEND) 150 mg, dexamethasone (DECADRON) 12 mg in sodium chloride 0.9 % 145 mL IVPB, , Intravenous,  Once, 1 of 4 cycles Administration:  (09/16/2018)  for chemotherapy treatment.       Problem List Patient Active Problem List   Diagnosis Date Noted  .  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 Metastasis ([place]) [C79.9] 09/02/2018  . 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 lung ([place]) [C34.12] 08/28/2018  . Anemia [D64.9] 04/09/2018  . Dark stools [R19.5] 04/09/2018  . RECTAL BLEEDING [K62.5] 05/31/2010  . ANOREXIA [R63.0] 05/31/2010  . WEIGHT LOSS, RECENT [R63.4] 05/31/2010    Past Medical History Past Medical History:  Diagnosis Date  . BPH (benign prostatic hyperplasia)   . Fracture   . Hypertension   . Metastasis ([place]) 09/02/2018    Past Surgical History Past Surgical History:  Procedure Laterality Date  . APPENDECTOMY    . EYE SURGERY  1975  . HAND SURGERY    . HERNIA REPAIR  2015  . PORTACATH PLACEMENT Right 09/13/2018   Procedure: INSERTION PORT-A-CATH;  Surgeon: Aviva Signs, MD;  Location: [facility];  Service: General;  Laterality: Right;    Family History Family History  Problem Relation Age of Onset  . Colon cancer Neg Hx      Social History  reports that he has been smoking cigarettes. He has been smoking about 0.50 packs per day. He has quit using smokeless tobacco.  His smokeless tobacco use included chew. He reports that he drinks alcohol. He reports that he has current or past drug history. Drugs: Marijuana and "Crack" cocaine.  Medications  Current Outpatient Medications:  .  amLODipine (NORVASC) 10 MG tablet, Take 1 tablet by mouth daily., Disp: , Rfl:  .  atorvastatin (LIPITOR) 40 MG tablet, Take 1 tablet by mouth at bedtime. , Disp: , Rfl:  .  CARBOPLATIN IV, Inject into the vein every 21 ( twenty-one) days., Disp: , Rfl:  .  dexamethasone (DECADRON) 4 MG tablet, Take 1 tablet (4 mg total) by mouth 2 (two) times daily., Disp: 60 tablet, Rfl: 1 .  dexamethasone (DECADRON) 4 MG tablet, Take 1 tab two times a day the day before Alimta chemo, day of chemotherapy and day after chemotherapy for a total of 3 days., Disp: 30 tablet, Rfl: 1 .  dutasteride (AVODART) 0.5 MG capsule,  Take 1 capsule by mouth daily., Disp: , Rfl:  .  ferrous sulfate 325 (65 FE) MG tablet, Take 325 mg by mouth 2 (two) times daily with a meal., Disp: , Rfl:  .  folic acid (FOLVITE) 1 MG tablet, Take 1 tablet (1 mg total) by mouth daily., Disp: 30 tablet, Rfl: 5 .  lidocaine-prilocaine (EMLA) cream, Apply small amount to port site and cover with plastic wrap one hour prior to appointment., Disp: 30 g, Rfl: 3 .  lisinopril-hydrochlorothiazide (PRINZIDE,ZESTORETIC) 20-12.5 MG tablet, Take 1 tablet by mouth 2 (two) times daily. , Disp: , Rfl:  .  magic mouthwash SOLN, Take 5 mLs by mouth 4 (four) times daily. Swish and spit, Disp: 240 mL, Rfl: 0 .  morphine (MS CONTIN) 30 MG 12 hr tablet, Take 1 tablet (30 mg total) by mouth every 12 (twelve) hours., Disp: 60 tablet, Rfl: 0 .  morphine (MSIR) 15 MG tablet, TAKE  (1)  TABLET  EVERY FOUR HOURS AS NEEDED FOR SEVERE PAIN.    - MAY MAKE DROWSY -, Disp: 180 tablet, Rfl: 0 .  nabumetone (RELAFEN) 750 MG tablet, Take 1 tablet by mouth 2 (two) times daily., Disp: , Rfl:  .  omeprazole (PRILOSEC) 20 MG capsule, Take 1 capsule by mouth daily., Disp: , Rfl:  .  Pembrolizumab (KEYTRUDA IV), Inject into the vein every 21 ( twenty-one) days., Disp: , Rfl:  .  PEMEtrexed Disodium (ALIMTA IV), Inject into the vein every 21 ( twenty-one) days., Disp: , Rfl:  .  prochlorperazine (COMPAZINE) 10 MG tablet, Take 1 tablet (10 mg total) by mouth every 6 (six) hours as needed (Nausea or vomiting)., Disp: 30 tablet, Rfl: 1 No current facility-administered medications for this visit.   Facility-Administered Medications Ordered in Other Visits:  .  0.9 %  sodium chloride infusion (Manually program via Guardrails IV Fluids), 250 mL, Intravenous, Once, Demorio Seeley, MD .  sodium chloride flush (NS) 0.9 % injection 10 mL, 10 mL, Intracatheter, PRN, Demisha Nokes, Mathis Dad, MD  Allergies Patient has no known allergies.  Review of Systems Review of Systems - Oncology ROS negative other  than blood tinged sputum   Physical Exam  Vitals Wt Readings from Last 3 Encounters:  09/27/18 99 lb 3.2 oz (45 kg)  09/16/18 101 lb 12.8 oz (46.2 kg)  09/12/18 103 lb 9.6 oz (47 kg)   Temp Readings from Last 3 Encounters:  09/27/18 98.4 F (36.9 C) (Oral)  09/16/18 98.2 F (36.8 C) (Oral)  09/13/18 98.1 F (36.7 C) (Oral)   BP Readings from Last 3 Encounters:  09/27/18 (!) 95/56  09/16/18 (!) 128/54  09/13/18 118/64   Pulse Readings from Last 3 Encounters:  09/27/18 70  09/16/18 60  09/13/18 67   Constitutional: Well-developed, well-nourished, and in no distress.   HENT: Head: Normocephalic and atraumatic.  Mouth/Throat: No oropharyngeal exudate. Mucosa moist. Eyes: Pupils are equal, round, and reactive to light. Conjunctivae are normal. No scleral icterus.  Neck: Normal range of motion. Neck supple. No JVD present.  Cardiovascular: Normal rate, regular rhythm and normal heart sounds.  Exam reveals no gallop and no friction rub.   No murmur heard. Pulmonary/Chest: Effort normal and breath sounds normal. No respiratory distress. Coarse breath sounds.   Abdominal: Soft. Bowel sounds are normal. No distension. There is no tenderness. There is no guarding.  Musculoskeletal: No edema or tenderness.  Lymphadenopathy: No cervical, axillary or supraclavicular adenopathy.  Neurological: Alert and oriented to person, place, and time. No cranial nerve deficit.  Skin: Skin is warm and dry. No rash noted. No erythema. No pallor.  Psychiatric: Affect and judgment normal.   Labs Appointment on 09/27/2018  Component Date Value Ref Range Status  . WBC 09/27/2018 1.4* 4.0 - 10.5 K/uL Final   Comment: WHITE COUNT CONFIRMED ON SMEAR THIS CRITICAL RESULT HAS VERIFIED AND BEEN CALLED TO PAGE,K. BY ED AGUNDIZ ON 11 15 2019 AT 1232, AND HAS BEEN READ BACK.    Marland Kitchen RBC 09/27/2018 3.23* 4.22 -  5.81 MIL/uL Final  . Hemoglobin 09/27/2018 10.2* 13.0 - 17.0 g/dL Final  . HCT 09/27/2018 30.7*  39.0 - 52.0 % Final  . MCV 09/27/2018 95.0  80.0 - 100.0 fL Final  . MCH 09/27/2018 31.6  26.0 - 34.0 pg Final  . MCHC 09/27/2018 33.2  30.0 - 36.0 g/dL Final  . RDW 09/27/2018 14.0  11.5 - 15.5 % Final  . Platelets 09/27/2018 19* 150 - 400 K/uL Final   Comment: PLATELET COUNT CONFIRMED BY SMEAR SPECIMEN CHECKED FOR CLOTS THIS CRITICAL RESULT HAS VERIFIED AND BEEN CALLED TO PAGE,K. BY ED AGUNDIZ ON 11 15 2019 AT 1232, AND HAS BEEN READ BACK.    Marland Kitchen nRBC 09/27/2018 0.0  0.0 - 0.2 % Final  . Neutrophils Relative % 09/27/2018 57  % Final  . Neutro Abs 09/27/2018 0.8* 1.7 - 7.7 K/uL Final  . Lymphocytes Relative 09/27/2018 21  % Final  . Lymphs Abs 09/27/2018 0.3* 0.7 - 4.0 K/uL Final  . Monocytes Relative 09/27/2018 20  % Final  . Monocytes Absolute 09/27/2018 0.3  0.1 - 1.0 K/uL Final  . Eosinophils Relative 09/27/2018 1  % Final  . Eosinophils Absolute 09/27/2018 0.0  0.0 - 0.5 K/uL Final  . Basophils Relative 09/27/2018 0  % Final  . Basophils Absolute 09/27/2018 0.0  0.0 - 0.1 K/uL Final  . Immature Granulocytes 09/27/2018 1  % Final  . Abs Immature Granulocytes 09/27/2018 0.01  0.00 - 0.07 K/uL Final   Performed at [facility], [address], [place], [place] [address]  . Ferritin 09/27/2018 236  24 - 336 ng/mL Final   Performed at [facility], [address], [place], [place] [address]  Infusion on 09/27/2018  Component Date Value Ref Range Status  . ABO/RH(D) 09/27/2018    Final                   Value:A NEG Performed at [facility], [address], [place], [place] [address]      Pathology Orders Placed This Encounter  Procedures  . CBC with Differential/Platelet    Standing Status:   Future    Number of Occurrences:   1    Standing Expiration Date:   09/28/2019  . Ferritin    Standing Status:   Future    Number of Occurrences:   1    Standing Expiration Date:   09/28/2019  . CBC with Differential/Platelet    Standing Status:   Future    Standing Expiration Date:    09/28/2019       Zoila Shutter MD

## 2018-09-27 NOTE — Progress Notes (Signed)
CRITICAL VALUE ALERT Critical value received:  WBC 1.4, Platelets 19,000 Date of notification:  09-27-18 Time of notification: 9030 Critical value read back:  Yes.   Nurse who received alert:  C. RN MD notified (1st ):  Dr. Walden Field

## 2018-09-27 NOTE — Progress Notes (Unsigned)
Patient presented today for Office visit with Dr. Walden Field. Labs were done, platelets were 19,000. Will give one unit of platelets per MD.   . Patient tolerated it well without problems. Vitals stable and discharged home from clinic ambulatory with sister at his side. Follow up as scheduled.

## 2018-09-29 LAB — BPAM PLATELET PHERESIS
BLOOD PRODUCT EXPIRATION DATE: 201911161427
ISSUE DATE / TIME: 201911151445
Unit Type and Rh: 5100

## 2018-09-29 LAB — PREPARE PLATELET PHERESIS: UNIT DIVISION: 0

## 2018-10-01 ENCOUNTER — Telehealth (HOSPITAL_COMMUNITY): Payer: Self-pay | Admitting: *Deleted

## 2018-10-03 ENCOUNTER — Inpatient Hospital Stay (HOSPITAL_COMMUNITY): Payer: Medicare HMO

## 2018-10-03 ENCOUNTER — Other Ambulatory Visit (HOSPITAL_COMMUNITY): Payer: Self-pay | Admitting: Internal Medicine

## 2018-10-03 ENCOUNTER — Ambulatory Visit (HOSPITAL_COMMUNITY)
Admission: RE | Admit: 2018-10-03 | Discharge: 2018-10-03 | Disposition: A | Discharge status: Home / Self Care | Payer: Medicare HMO | Source: Ambulatory Visit | Attending: Internal Medicine | Admitting: Internal Medicine

## 2018-10-03 DIAGNOSIS — C3492 Malignant neoplasm of unspecified part of left bronchus or lung: Secondary | ICD-10-CM | POA: Insufficient documentation

## 2018-10-03 DIAGNOSIS — D696 Thrombocytopenia, unspecified: Secondary | ICD-10-CM

## 2018-10-03 DIAGNOSIS — R0602 Shortness of breath: Secondary | ICD-10-CM | POA: Diagnosis present

## 2018-10-03 DIAGNOSIS — Z5112 Encounter for antineoplastic immunotherapy: Secondary | ICD-10-CM | POA: Diagnosis not present

## 2018-10-03 LAB — CBC WITH DIFFERENTIAL/PLATELET
Abs Immature Granulocytes: 0.1 10*3/uL — ABNORMAL HIGH (ref 0.00–0.07)
BASOS ABS: 0 10*3/uL (ref 0.0–0.1)
BASOS PCT: 1 %
EOS ABS: 0 10*3/uL (ref 0.0–0.5)
Eosinophils Relative: 0 %
HEMATOCRIT: 31.2 % — AB (ref 39.0–52.0)
Hemoglobin: 10.3 g/dL — ABNORMAL LOW (ref 13.0–17.0)
IMMATURE GRANULOCYTES: 2 %
LYMPHS ABS: 0.3 10*3/uL — AB (ref 0.7–4.0)
Lymphocytes Relative: 5 %
MCH: 30.9 pg (ref 26.0–34.0)
MCHC: 33 g/dL (ref 30.0–36.0)
MCV: 93.7 fL (ref 80.0–100.0)
Monocytes Absolute: 0.6 10*3/uL (ref 0.1–1.0)
Monocytes Relative: 13 %
NEUTROS PCT: 79 %
NRBC: 0 % (ref 0.0–0.2)
Neutro Abs: 3.8 10*3/uL (ref 1.7–7.7)
PLATELETS: 104 10*3/uL — AB (ref 150–400)
RBC: 3.33 MIL/uL — AB (ref 4.22–5.81)
RDW: 14.2 % (ref 11.5–15.5)
WBC: 4.8 10*3/uL (ref 4.0–10.5)

## 2018-10-03 MED ORDER — HEPARIN SOD (PORK) LOCK FLUSH 100 UNIT/ML IV SOLN
500.0000 [IU] | Freq: Once | INTRAVENOUS | Status: AC
  Administered 2018-10-03: 500 [IU] via INTRAVENOUS
  Filled 2018-10-03: qty 5

## 2018-10-03 MED ORDER — FUROSEMIDE 10 MG/ML IJ SOLN
40.0000 mg | Freq: Once | INTRAMUSCULAR | Status: AC
  Administered 2018-10-03: 40 mg via INTRAVENOUS
  Filled 2018-10-03: qty 4

## 2018-10-03 MED ORDER — SODIUM CHLORIDE 0.9% FLUSH
10.0000 mL | Freq: Once | INTRAVENOUS | Status: AC
  Administered 2018-10-03: 10 mL via INTRAVENOUS

## 2018-10-03 NOTE — Progress Notes (Signed)
Patient walks in to the clinic today c/o continued congestion and cough. He has 2 days remaining on Levaquin that was prescribed by Dr. Walden Field.  He also c/o swelling to bilateral ankles and feet.  He has 3-4+ edema to feet. No redness no pain.  Patient states that this has been there for about 3 days.    I spoke with Dr. Walden Field and she wants patient to have chest xray and then have some lasix.  She has placed orders and patient is aware.   Patient will have chest x-ray and then come back up to clinic for diuretics.    Charge nurse aware of add on.

## 2018-10-03 NOTE — Progress Notes (Signed)
Patient come in today for complaints of swelling in his feet, will give lasix 40 mg IV per Dr. Walden Field. Patient has no other complaints right now. Encouraged patient to keep his feet elevated as much as possible.   Lasix given per orders. Patient tolerated it well without problems. Vitals stable and discharged home from clinic ambulatory. Follow up as scheduled.

## 2018-10-04 NOTE — Patient Instructions (Signed)
[facility] at [facility] Discharge Instructions     Thank you for choosing [facility] at [facility] to provide your oncology and hematology care.  To afford each patient quality time with our provider, please arrive at least 15 minutes before your scheduled appointment time.   If you have a lab appointment with the Cancer Center please come in thru the  Main Entrance and check in at the main information desk  You need to re-schedule your appointment should you arrive 10 or more minutes late.  We strive to give you quality time with our providers, and arriving late affects you and other patients whose appointments are after yours.  Also, if you no show three or more times for appointments you may be dismissed from the clinic at the providers discretion.     Again, thank you for choosing [facility].  Our hope is that these requests will decrease the amount of time that you wait before being seen by our physicians.       _____________________________________________________________  Should you have questions after your visit to [facility], please contact our office at [phone] between the hours of 8:00 a.m. and 4:30 p.m.  Voicemails left after 4:00 p.m. will not be returned until the following business day.  For prescription refill requests, have your pharmacy contact our office and allow 72 hours.    Cancer Center Support Programs:   > Cancer Support Group  2nd Tuesday of the month 1pm-2pm, Journey Room    

## 2018-10-07 ENCOUNTER — Other Ambulatory Visit: Payer: Self-pay

## 2018-10-07 ENCOUNTER — Inpatient Hospital Stay (HOSPITAL_COMMUNITY): Payer: Medicare HMO

## 2018-10-07 ENCOUNTER — Encounter (HOSPITAL_COMMUNITY): Payer: Self-pay | Admitting: Internal Medicine

## 2018-10-07 ENCOUNTER — Inpatient Hospital Stay (HOSPITAL_BASED_OUTPATIENT_CLINIC_OR_DEPARTMENT_OTHER): Payer: Medicare HMO | Admitting: Internal Medicine

## 2018-10-07 VITALS — BP 111/56 | HR 62 | Temp 97.5°F | Resp 16

## 2018-10-07 VITALS — BP 119/62 | HR 67 | Temp 97.7°F | Resp 14 | Wt 103.1 lb

## 2018-10-07 DIAGNOSIS — Z5112 Encounter for antineoplastic immunotherapy: Secondary | ICD-10-CM | POA: Diagnosis not present

## 2018-10-07 DIAGNOSIS — C7931 Secondary malignant neoplasm of brain: Secondary | ICD-10-CM

## 2018-10-07 DIAGNOSIS — C7951 Secondary malignant neoplasm of bone: Secondary | ICD-10-CM

## 2018-10-07 DIAGNOSIS — M545 Low back pain: Secondary | ICD-10-CM

## 2018-10-07 DIAGNOSIS — C3412 Malignant neoplasm of upper lobe, left bronchus or lung: Secondary | ICD-10-CM

## 2018-10-07 DIAGNOSIS — D696 Thrombocytopenia, unspecified: Secondary | ICD-10-CM

## 2018-10-07 DIAGNOSIS — C3492 Malignant neoplasm of unspecified part of left bronchus or lung: Secondary | ICD-10-CM

## 2018-10-07 DIAGNOSIS — Z7289 Other problems related to lifestyle: Secondary | ICD-10-CM

## 2018-10-07 DIAGNOSIS — Z720 Tobacco use: Secondary | ICD-10-CM

## 2018-10-07 DIAGNOSIS — M25473 Effusion, unspecified ankle: Secondary | ICD-10-CM

## 2018-10-07 DIAGNOSIS — I10 Essential (primary) hypertension: Secondary | ICD-10-CM

## 2018-10-07 DIAGNOSIS — D72819 Decreased white blood cell count, unspecified: Secondary | ICD-10-CM

## 2018-10-07 LAB — CBC WITH DIFFERENTIAL/PLATELET
ABS IMMATURE GRANULOCYTES: 0.13 10*3/uL — AB (ref 0.00–0.07)
BASOS ABS: 0 10*3/uL (ref 0.0–0.1)
Basophils Relative: 0 %
EOS ABS: 0 10*3/uL (ref 0.0–0.5)
Eosinophils Relative: 0 %
HEMATOCRIT: 31 % — AB (ref 39.0–52.0)
Hemoglobin: 10.4 g/dL — ABNORMAL LOW (ref 13.0–17.0)
IMMATURE GRANULOCYTES: 1 %
Lymphocytes Relative: 5 %
Lymphs Abs: 0.5 10*3/uL — ABNORMAL LOW (ref 0.7–4.0)
MCH: 31.7 pg (ref 26.0–34.0)
MCHC: 33.5 g/dL (ref 30.0–36.0)
MCV: 94.5 fL (ref 80.0–100.0)
Monocytes Absolute: 1.1 10*3/uL — ABNORMAL HIGH (ref 0.1–1.0)
Monocytes Relative: 11 %
NEUTROS ABS: 7.7 10*3/uL (ref 1.7–7.7)
NEUTROS PCT: 83 %
NRBC: 0 % (ref 0.0–0.2)
Platelets: 223 10*3/uL (ref 150–400)
RBC: 3.28 MIL/uL — ABNORMAL LOW (ref 4.22–5.81)
RDW: 14.9 % (ref 11.5–15.5)
WBC: 9.4 10*3/uL (ref 4.0–10.5)

## 2018-10-07 LAB — COMPREHENSIVE METABOLIC PANEL
ALBUMIN: 2.9 g/dL — AB (ref 3.5–5.0)
ALT: 23 U/L (ref 0–44)
AST: 27 U/L (ref 15–41)
Alkaline Phosphatase: 102 U/L (ref 38–126)
Anion gap: 9 (ref 5–15)
BILIRUBIN TOTAL: 0.4 mg/dL (ref 0.3–1.2)
BUN: 12 mg/dL (ref 8–23)
CHLORIDE: 93 mmol/L — AB (ref 98–111)
CO2: 32 mmol/L (ref 22–32)
CREATININE: 0.79 mg/dL (ref 0.61–1.24)
Calcium: 9 mg/dL (ref 8.9–10.3)
GFR calc Af Amer: >60 mL/min (ref >60)
GFR calc non Af Amer: >60 mL/min (ref >60)
GLUCOSE: 93 mg/dL (ref 70–99)
POTASSIUM: 3.5 mmol/L (ref 3.5–5.1)
Sodium: 134 mmol/L — ABNORMAL LOW (ref 135–145)
Total Protein: 6.7 g/dL (ref 6.5–8.1)

## 2018-10-07 LAB — RAPID URINE DRUG SCREEN, HOSP PERFORMED
Amphetamines: NOT DETECTED
BARBITURATES: NOT DETECTED
Benzodiazepines: NOT DETECTED
Cocaine: NOT DETECTED
Opiates: POSITIVE — AB
TETRAHYDROCANNABINOL: NOT DETECTED

## 2018-10-07 LAB — TSH: TSH: 0.296 u[IU]/mL — ABNORMAL LOW (ref 0.350–4.500)

## 2018-10-07 MED ORDER — SODIUM CHLORIDE 0.9% FLUSH
10.0000 mL | INTRAVENOUS | Status: DC | PRN
  Administered 2018-10-07: 10 mL
  Filled 2018-10-07: qty 10

## 2018-10-07 MED ORDER — HEPARIN SOD (PORK) LOCK FLUSH 100 UNIT/ML IV SOLN
500.0000 [IU] | Freq: Once | INTRAVENOUS | Status: AC | PRN
  Administered 2018-10-07: 500 [IU]

## 2018-10-07 MED ORDER — SODIUM CHLORIDE 0.9 % IV SOLN
Freq: Once | INTRAVENOUS | Status: AC
  Administered 2018-10-07: 11:00:00 via INTRAVENOUS

## 2018-10-07 MED ORDER — SODIUM CHLORIDE 0.9 % IV SOLN
475.0000 mg/m2 | Freq: Once | INTRAVENOUS | Status: AC
  Administered 2018-10-07: 700 mg via INTRAVENOUS
  Filled 2018-10-07: qty 20

## 2018-10-07 MED ORDER — MORPHINE SULFATE 30 MG PO TABS: ORAL_TABLET | ORAL | 0 refills | Status: DC

## 2018-10-07 MED ORDER — PALONOSETRON HCL INJECTION 0.25 MG/5ML
0.2500 mg | Freq: Once | INTRAVENOUS | Status: AC
  Administered 2018-10-07: 0.25 mg via INTRAVENOUS

## 2018-10-07 MED ORDER — SODIUM CHLORIDE 0.9 % IV SOLN
368.0000 mg | Freq: Once | INTRAVENOUS | Status: AC
  Administered 2018-10-07: 370 mg via INTRAVENOUS
  Filled 2018-10-07: qty 37

## 2018-10-07 MED ORDER — PALONOSETRON HCL INJECTION 0.25 MG/5ML
INTRAVENOUS | Status: AC
  Filled 2018-10-07: qty 5

## 2018-10-07 MED ORDER — ZOLEDRONIC ACID 4 MG/100ML IV SOLN
4.0000 mg | Freq: Once | INTRAVENOUS | Status: AC
  Administered 2018-10-07: 4 mg via INTRAVENOUS
  Filled 2018-10-07: qty 100

## 2018-10-07 MED ORDER — SODIUM CHLORIDE 0.9 % IV SOLN
Freq: Once | INTRAVENOUS | Status: AC
  Administered 2018-10-07: 12:00:00 via INTRAVENOUS
  Filled 2018-10-07: qty 5

## 2018-10-07 MED ORDER — SODIUM CHLORIDE 0.9 % IV SOLN
200.0000 mg | Freq: Once | INTRAVENOUS | Status: AC
  Administered 2018-10-07: 200 mg via INTRAVENOUS
  Filled 2018-10-07: qty 8

## 2018-10-07 NOTE — Progress Notes (Signed)
Diagnosis No diagnosis found.  Staging Cancer Staging No matching staging information was found for the patient.  Assessment and Plan:   1.  Stage IV NSCLC.  Patient was complaining of several month history of back pain.  He had a CT of the chest done 07/24/2018 that showed IMPRESSION: 1. Dominant spiculated left upper lobe mass with multiple enlarged mediastinal, hilar and left axillary lymph nodes, multiple additional pulmonary nodules, a probable left hepatic metastasis and possible bilateral adrenal metastases. Findings are consistent with widespread lung cancer. Tissue sampling recommended. 2. Sclerosis of the T1 vertebral body, also suspicious for metastatic disease. 3. Aortic Atherosclerosis (ICD10-I70.0) and Emphysema (ICD10-J43.9). 4. These results will be called to the ordering clinician or representative by the Radiologist Assistant, and communication documented in the PACS or zVision Dashboard.  He has a long smoking history.  He also reports alcohol use and marijuana.    Pet scan done 08/12/2018 reviewed and showed IMPRESSION: 7.4 cm hypermetabolic mass in the left upper lobe, consistent with primary lung carcinoma.  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  MRI of brain done 08/12/2018 reviewed and showed  Impression:   Multiple brain metastases scattered throughout the cerebellum and both cerebral hemispheres. Vasogenic edema most pronounced in the right temporal lobe and right frontal lobe and to a lesser extent in the left frontal lobe. Mild mass effect with right-to-left shift of 1 or 2 mm.  Total of 4 cerebellar metastases identified, the largest measuring 6 mm in the right hemisphere. Total of 8 metastases identified in the right cerebral hemisphere, the largest measuring 17 mm in the right temporal lobe. Total of 4 metastases identified  in the left hemisphere, the largest measuring 19 mm in the left frontal lobe.  There is considerable motion degradation, and small metastatic lesions may be inapparent.  Few old scattered cortical infarctions.  Pt had biopsy of left pectoral LN on 08/26/2018 and pathology returned as Adenocarcinoma.  Foundation testing and lung biomarkers requested and results showed PDL1 Score of 10%.  Pt had no reported alterations of EGFR, ALK, ROS1.  No other actionable mutations identified.    Pt has advanced NSCLC.  Pt presented with extensive disease with brain mets.  He has completed RT.    Pt is here for C2 of Pembro dosed at 200 mg IV every 3 weeks with Carboplatin AUC 5 every 3 weeks with Alimta 500 mg/m2 every 3 weeks for 4 cycles with Alimta and Pembro maintenance.   Pt will continue B12 every 9 weeks as therapy proceeds.  He should continue folic acid daily and continue Decadron premed first 3 days of each cycle.    He will be set up for repeat imaging after C4 for interval evaluation.    Labs done 10/07/2018 reviewed and showed WBC 9.4 HB 10.4 plts 223,000.  Chemistries WNL wit K+ 3.5 Cr 0.79 and normal LFTs.  TSH is 0.296.  Will continue to monitor labs as therapy proceeds.    2.  Thrombocytopenia.  Plt count improved at 223,000 after transfusion.  Will continue to monitor counts as therapy proceeds.    3.  Leukopenia.  WBC improved at 9.4.   Will continue to monitor counts as therapy proceeds.   4  Blood tinged sputum.  Pt had no further episodes since platelet transfusion.  Advised to notify the office if symptoms recur.  Felt likely due to thrombocytopenia.  5.   Brain mets.  MRI of brain showed findings concerning of brain mets.  He has completed RT.  Pt completing steroid taper.    6.  Ankle swelling.  Pt off BP meds due to problems with hypotension.  Pt advised to resume BP meds as he was on diuretics and will determine if symptoms improve.  No calf discomfort.   7.  HTN.  BP  improved at 119/62.  Pt advised to resume BP meds.  Follow-up with PCP for monitoring.    8.  Pain.  Pt reports more pain in back.   UDS negative other than opiates.  MSIR increased to 30 mg q 4 Hrs prn.  Rx sent to pharmacy # [id number].  He should continue MS contin 30 mg po bid.   Family support recommended to continue montioring.    8.  Bone mets.  Zometa for bone strengthening every 3-4 weeks due to bone lesions noted on imaging.    25 minutes spent with more than 50% spent in counseling and coordination of care.    Interval history:  Historical data obtained from the note dated 07/30/2018:  67-year-old male referred for evaluation due to abnormal scan.  He was complaining of upper back pain for several months.  He also reported weight loss, dizziness.  Patient had a CT of the chest done 07/24/2018 that showed IMPRESSION: 1. Dominant spiculated left upper lobe mass with multiple enlarged mediastinal, hilar and left axillary lymph nodes, multiple additional pulmonary nodules, a probable left hepatic metastasis and possible bilateral adrenal metastases. Findings are consistent with widespread lung cancer. Tissue sampling recommended. 2. Sclerosis of the T1 vertebral body, also suspicious for metastatic disease. 3. Aortic Atherosclerosis (ICD10-I70.0) and Emphysema (ICD10-J43.9). 4. These results will be called to the ordering clinician or representative by the Radiologist Assistant, and communication documented in the PACS or zVision Dashboard.  He has a long smoking history.  He also reports alcohol use in marijuana.    Current Status:  Pt is seen today for follow-up prior to C2 of Keytruda, Alimta and Carboplatin.  Sister reports some ankle swelling.  She feels pain is more intense from previous.     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 lung (HCC)   08/28/2018 Initial Diagnosis    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 lung (HCC)    09/04/2018 -  Chemotherapy    The patient had  palonosetron (ALOXI) injection 0.25 mg, 0.25 mg, Intravenous,  Once, 2 of 4 cycles Administration: 0.25 mg (09/16/2018) PEMEtrexed (ALIMTA) 700 mg in sodium chloride 0.9 % 100 mL chemo infusion, 480 mg/m2 = 725 mg, Intravenous,  Once, 2 of 6 cycles Administration: 700 mg (09/16/2018) CARBOplatin (PARAPLATIN) 370 mg in sodium chloride 0.9 % 250 mL chemo infusion, 370 mg (100 % of original dose 368 mg), Intravenous,  Once, 2 of 4 cycles Dose modification:   (original dose 368 mg, Cycle 1),   (original dose 368 mg, Cycle 2) Administration: 370 mg (09/16/2018) pembrolizumab (KEYTRUDA) 200 mg in sodium chloride 0.9 % 50 mL chemo infusion, 200 mg, Intravenous, Once, 2 of 6 cycles Administration: 200 mg (09/16/2018) fosaprepitant (EMEND) 150 mg, dexamethasone (DECADRON) 12 mg in sodium chloride 0.9 % 145 mL IVPB, , Intravenous,  Once, 2 of 4 cycles Administration:  (09/16/2018)  for chemotherapy treatment.      Metastasis (HCC)   09/02/2018 Initial Diagnosis    Metastasis (HCC)    09/04/2018 -  Chemotherapy    The patient had   palonosetron (ALOXI) injection 0.25 mg, 0.25 mg, Intravenous,  Once, 2 of 4 cycles Administration: 0.25 mg (09/16/2018) PEMEtrexed (ALIMTA) 700 mg in sodium chloride 0.9 % 100 mL chemo infusion, 480 mg/m2 = 725 mg, Intravenous,  Once, 2 of 6 cycles Administration: 700 mg (09/16/2018) CARBOplatin (PARAPLATIN) 370 mg in sodium chloride 0.9 % 250 mL chemo infusion, 370 mg (100 % of original dose 368 mg), Intravenous,  Once, 2 of 4 cycles Dose modification:   (original dose 368 mg, Cycle 1),   (original dose 368 mg, Cycle 2) Administration: 370 mg (09/16/2018) pembrolizumab (KEYTRUDA) 200 mg in sodium chloride 0.9 % 50 mL chemo infusion, 200 mg, Intravenous, Once, 2 of 6 cycles Administration: 200 mg (09/16/2018) fosaprepitant (EMEND) 150 mg, dexamethasone (DECADRON) 12 mg in sodium chloride 0.9 % 145 mL IVPB, , Intravenous,  Once, 2 of 4 cycles Administration:  (09/16/2018)  for  chemotherapy treatment.       Problem List Patient Active Problem List   Diagnosis Date Noted  . 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 Metastasis ([place]) [C79.9] 09/02/2018  . 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 lung ([place]) [C34.12] 08/28/2018  . Anemia [D64.9] 04/09/2018  . Dark stools [R19.5] 04/09/2018  . RECTAL BLEEDING [K62.5] 05/31/2010  . ANOREXIA [R63.0] 05/31/2010  . WEIGHT LOSS, RECENT [R63.4] 05/31/2010    Past Medical History Past Medical History:  Diagnosis Date  . BPH (benign prostatic hyperplasia)   . Fracture   . Hypertension   . Metastasis ([place]) 09/02/2018    Past Surgical History Past Surgical History:  Procedure Laterality Date  . APPENDECTOMY    . EYE SURGERY  1975  . HAND SURGERY    . HERNIA REPAIR  2015  . PORTACATH PLACEMENT Right 09/13/2018   Procedure: INSERTION PORT-A-CATH;  Surgeon: Aviva Signs, MD;  Location: [facility];  Service: General;  Laterality: Right;    Family History Family History  Problem Relation Age of Onset  . Colon cancer Neg Hx      Social History  reports that he has been smoking cigarettes. He has been smoking about 0.50 packs per day. He has quit using smokeless tobacco.  His smokeless tobacco use included chew. He reports that he drinks alcohol. He reports that he has current or past drug history. Drugs: Marijuana and "Crack" cocaine.  Medications  Current Outpatient Medications:  .  amLODipine (NORVASC) 10 MG tablet, Take 1 tablet by mouth daily., Disp: , Rfl:  .  atorvastatin (LIPITOR) 40 MG tablet, Take 1 tablet by mouth at bedtime. , Disp: , Rfl:  .  CARBOPLATIN IV, Inject into the vein every 21 ( twenty-one) days., Disp: , Rfl:  .  dexamethasone (DECADRON) 4 MG tablet, Take 1 tablet (4 mg total) by mouth 2 (two) times daily., Disp: 60 tablet, Rfl: 1 .  dexamethasone (DECADRON) 4 MG tablet, Take 1 tab two times a day the day before Alimta chemo, day of chemotherapy and day  after chemotherapy for a total of 3 days., Disp: 30 tablet, Rfl: 1 .  dutasteride (AVODART) 0.5 MG capsule, Take 1 capsule by mouth daily., Disp: , Rfl:  .  ferrous sulfate 325 (65 FE) MG tablet, Take 325 mg by mouth 2 (two) times daily with a meal., Disp: , Rfl:  .  folic acid (FOLVITE) 1 MG tablet, Take 1 tablet (1 mg total) by mouth daily., Disp: 30 tablet, Rfl: 5 .  lidocaine-prilocaine (EMLA) cream, Apply small amount to  port site and cover with plastic wrap one hour prior to appointment., Disp: 30 g, Rfl: 3 .  lisinopril-hydrochlorothiazide (PRINZIDE,ZESTORETIC) 20-12.5 MG tablet, Take 1 tablet by mouth 2 (two) times daily. , Disp: , Rfl:  .  magic mouthwash SOLN, Take 5 mLs by mouth 4 (four) times daily. Swish and spit, Disp: 240 mL, Rfl: 0 .  morphine (MS CONTIN) 30 MG 12 hr tablet, Take 1 tablet (30 mg total) by mouth every 12 (twelve) hours., Disp: 60 tablet, Rfl: 0 .  morphine (MSIR) 30 MG tablet, TAKE  (1)  TABLET  EVERY FOUR HOURS AS NEEDED FOR SEVERE PAIN.    - MAY MAKE DROWSY -, Disp: 90 tablet, Rfl: 0 .  nabumetone (RELAFEN) 750 MG tablet, Take 1 tablet by mouth 2 (two) times daily., Disp: , Rfl:  .  omeprazole (PRILOSEC) 20 MG capsule, Take 1 capsule by mouth daily., Disp: , Rfl:  .  Pembrolizumab (KEYTRUDA IV), Inject into the vein every 21 ( twenty-one) days., Disp: , Rfl:  .  PEMEtrexed Disodium (ALIMTA IV), Inject into the vein every 21 ( twenty-one) days., Disp: , Rfl:  .  prochlorperazine (COMPAZINE) 10 MG tablet, Take 1 tablet (10 mg total) by mouth every 6 (six) hours as needed (Nausea or vomiting)., Disp: 30 tablet, Rfl: 1 No current facility-administered medications for this visit.   Facility-Administered Medications Ordered in Other Visits:  .  CARBOplatin (PARAPLATIN) 370 mg in sodium chloride 0.9 % 250 mL chemo infusion, 370 mg, Intravenous, Once, , , MD .  fosaprepitant (EMEND) 150 mg, dexamethasone (DECADRON) 12 mg in sodium chloride 0.9 % 145 mL IVPB, ,  Intravenous, Once, , , MD .  heparin lock flush 100 unit/mL, 500 Units, Intracatheter, Once PRN, , , MD .  pembrolizumab (KEYTRUDA) 200 mg in sodium chloride 0.9 % 50 mL chemo infusion, 200 mg, Intravenous, Once, , , MD .  PEMEtrexed (ALIMTA) 700 mg in sodium chloride 0.9 % 100 mL chemo infusion, 475 mg/m2 (Treatment Plan Recorded), Intravenous, Once, , , MD .  sodium chloride flush (NS) 0.9 % injection 10 mL, 10 mL, Intracatheter, PRN, , , MD .  sodium chloride flush (NS) 0.9 % injection 10 mL, 10 mL, Intracatheter, PRN, , , MD, 10 mL at 10/07/18 1045 .  Zoledronic Acid (ZOMETA) IVPB 4 mg, 4 mg, Intravenous, Once, , , MD, Last Rate: 400 mL/hr at 10/07/18 1120, 4 mg at 10/07/18 1120  Allergies Patient has no known allergies.  Review of Systems Review of Systems - Oncology ROS negative other than ankle swelling.  Pain not controlled.     Physical Exam  Vitals Wt Readings from Last 3 Encounters:  10/07/18 103 lb 1.6 oz (46.8 kg)  09/27/18 99 lb 3.2 oz (45 kg)  09/16/18 101 lb 12.8 oz (46.2 kg)   Temp Readings from Last 3 Encounters:  10/07/18 97.7 F (36.5 C) (Oral)  10/03/18 98.2 F (36.8 C) (Oral)  09/27/18 98.4 F (36.9 C) (Oral)   BP Readings from Last 3 Encounters:  10/07/18 119/62  10/03/18 (!) 97/51  09/27/18 (!) 95/56   Pulse Readings from Last 3 Encounters:  10/07/18 67  10/03/18 79  09/27/18 70   Constitutional: Well-developed, well-nourished, and in no distress.   HENT: Head: Normocephalic and atraumatic.  Mouth/Throat: No oropharyngeal exudate. Mucosa moist. Eyes: Pupils are equal, round, and reactive to light. Conjunctivae are normal. No scleral icterus.  Neck: Normal range of motion. Neck supple. No JVD present.  Cardiovascular: Normal rate, regular   rhythm and normal heart sounds.  Exam reveals no gallop and no friction rub.   No murmur heard. Pulmonary/Chest: Effort normal.  Coarse BS.   No respiratory distress. No wheezes.No rales.  Abdominal: Soft. Bowel sounds are normal. No distension. There is no tenderness. There is no guarding.  Musculoskeletal: Ankle edema noted bilaterally.   Lymphadenopathy: No cervical, axillary or supraclavicular adenopathy.  Neurological: Alert and oriented to person, place, and time. No cranial nerve deficit.  Skin: Skin is warm and dry. No rash noted. No erythema. No pallor.  Psychiatric: Affect and judgment normal.   Labs Appointment on 10/07/2018  Component Date Value Ref Range Status  . TSH 10/07/2018 0.296* 0.350 - 4.500 uIU/mL Final   Comment: Performed by a 3rd Generation assay with a functional sensitivity of <=0.01 uIU/mL. Performed at [facility], [address], [place], [place] [address]   . WBC 10/07/2018 9.4  4.0 - 10.5 K/uL Final  . RBC 10/07/2018 3.28* 4.22 - 5.81 MIL/uL Final  . Hemoglobin 10/07/2018 10.4* 13.0 - 17.0 g/dL Final  . HCT 10/07/2018 31.0* 39.0 - 52.0 % Final  . MCV 10/07/2018 94.5  80.0 - 100.0 fL Final  . MCH 10/07/2018 31.7  26.0 - 34.0 pg Final  . MCHC 10/07/2018 33.5  30.0 - 36.0 g/dL Final  . RDW 10/07/2018 14.9  11.5 - 15.5 % Final  . Platelets 10/07/2018 223  150 - 400 K/uL Final  . nRBC 10/07/2018 0.0  0.0 - 0.2 % Final  . Neutrophils Relative % 10/07/2018 83  % Final  . Neutro Abs 10/07/2018 7.7  1.7 - 7.7 K/uL Final  . Lymphocytes Relative 10/07/2018 5  % Final  . Lymphs Abs 10/07/2018 0.5* 0.7 - 4.0 K/uL Final  . Monocytes Relative 10/07/2018 11  % Final  . Monocytes Absolute 10/07/2018 1.1* 0.1 - 1.0 K/uL Final  . Eosinophils Relative 10/07/2018 0  % Final  . Eosinophils Absolute 10/07/2018 0.0  0.0 - 0.5 K/uL Final  . Basophils Relative 10/07/2018 0  % Final  . Basophils Absolute 10/07/2018 0.0  0.0 - 0.1 K/uL Final  . Immature Granulocytes 10/07/2018 1  % Final  . Abs Immature Granulocytes 10/07/2018 0.13* 0.00 - 0.07 K/uL Final   Performed at [facility], [address]  [place], [place] [address]  . Sodium 10/07/2018 134* 135 - 145 mmol/L Final  . Potassium 10/07/2018 3.5  3.5 - 5.1 mmol/L Final  . Chloride 10/07/2018 93* 98 - 111 mmol/L Final  . CO2 10/07/2018 32  22 - 32 mmol/L Final  . Glucose, Bld 10/07/2018 93  70 - 99 mg/dL Final  . BUN 10/07/2018 12  8 - 23 mg/dL Final  . Creatinine, Ser 10/07/2018 0.79  0.61 - 1.24 mg/dL Final  . Calcium 10/07/2018 9.0  8.9 - 10.3 mg/dL Final  . Total Protein 10/07/2018 6.7  6.5 - 8.1 g/dL Final  . Albumin 10/07/2018 2.9* 3.5 - 5.0 g/dL Final  . AST 10/07/2018 27  15 - 41 U/L Final  . ALT 10/07/2018 23  0 - 44 U/L Final  . Alkaline Phosphatase 10/07/2018 102  38 - 126 U/L Final  . Total Bilirubin 10/07/2018 0.4  0.3 - 1.2 mg/dL Final  . GFR calc non Af Amer 10/07/2018 >60  >60 mL/min Final  . GFR calc Af Amer 10/07/2018 >60  >60 mL/min Final   Comment: (NOTE) The eGFR has been calculated using the CKD EPI equation. This calculation has not been validated in all clinical  situations. eGFR's persistently <60 mL/min signify possible Chronic Kidney Disease.   Georgiann Hahn gap 10/07/2018 9  5 - 15 Final   Performed at [facility], [address], [place], [place] [address]  . Opiates 10/07/2018 POSITIVE* NONE DETECTED Final  . Cocaine 10/07/2018 NONE DETECTED  NONE DETECTED Final  . Benzodiazepines 10/07/2018 NONE DETECTED  NONE DETECTED Final  . Amphetamines 10/07/2018 NONE DETECTED  NONE DETECTED Final  . Tetrahydrocannabinol 10/07/2018 NONE DETECTED  NONE DETECTED Final  . Barbiturates 10/07/2018 NONE DETECTED  NONE DETECTED Final   Comment: (NOTE) DRUG SCREEN FOR MEDICAL PURPOSES ONLY.  IF CONFIRMATION IS NEEDED FOR ANY PURPOSE, NOTIFY LAB WITHIN 5 DAYS. LOWEST DETECTABLE LIMITS FOR URINE DRUG SCREEN Drug Class                     Cutoff (ng/mL) Amphetamine and metabolites    1000 Barbiturate and metabolites    200 Benzodiazepine                 814 Tricyclics and metabolites     300 Opiates and metabolites         300 Cocaine and metabolites        300 THC                            50 Performed at [facility], [address], [place], [place] [address]      Pathology No orders of the defined types were placed in this encounter.      Zoila Shutter MD

## 2018-10-07 NOTE — Progress Notes (Signed)
1030 labs reviewed and patient seen by Dr. Walden Field who approved patient for treatment today.   [facility] Nurse Talerico tolerated Carbo/Pem/Pem and Zometa without incident or complaint. VSS upon completion of treatment. Discharged self ambulatory in presence of daughter.

## 2018-10-07 NOTE — Patient Instructions (Signed)
[facility] Discharge Instructions for Patients Receiving Chemotherapy   Beginning January 23rd 2017 lab work for the [facility] will be done in the  Main lab at [facility] on 1st floor. If you have a lab appointment with the [facility] please come in thru the  Main Entrance and check in at the main information desk  Today you received the following chemotherapy agents Keytruda, Alimta, Carboplatin, and Zometa.  To help prevent nausea and vomiting after your treatment, we encourage you to take your nausea medication   If you develop nausea and vomiting, or diarrhea that is not controlled by your medication, call the clinic.  The clinic phone number is [phone]. Office hours are Monday-Friday 8:30am-5:00pm.  BELOW ARE SYMPTOMS THAT SHOULD BE REPORTED IMMEDIATELY:  *FEVER GREATER THAN 101.0 F  *CHILLS WITH OR WITHOUT FEVER  NAUSEA AND VOMITING THAT IS NOT CONTROLLED WITH YOUR NAUSEA MEDICATION  *UNUSUAL SHORTNESS OF BREATH  *UNUSUAL BRUISING OR BLEEDING  TENDERNESS IN MOUTH AND THROAT WITH OR WITHOUT PRESENCE OF ULCERS  *URINARY PROBLEMS  *BOWEL PROBLEMS  UNUSUAL RASH Items with * indicate a potential emergency and should be followed up as soon as possible. If you have an emergency after office hours please contact your primary care physician or go to the nearest emergency department.  Please call the clinic during office hours if you have any questions or concerns.   You may also contact the Patient Navigator at [phone] should you have any questions or need assistance in obtaining follow up care.      Resources For Cancer Patients and their Caregivers ? American Cancer Society: Can assist with transportation, wigs, general needs, runs Look Good Feel Better.        ([phone] ? Cancer Care: Provides financial assistance, online support groups, medication/co-pay assistance.  1-800-813-HOPE 778-544-2966) ? [place] Assists [place] cancer patients and their families through emotional , educational and financial support.  614-192-5418 ? [place] DSS Where to apply for food stamps, Medicaid and utility assistance. (212)215-9467 ? [facility]: Transportation to medical appointments. 954 863 7494 ? Social Security Administration: May apply for disability if have a Stage IV cancer. 864-383-4770 743 599 2563 ? [employer], Disability and Transit Services: Assists with nutrition, care and transit needs. 480-146-2307

## 2018-10-07 NOTE — Patient Instructions (Signed)
[facility]  Discharge Instructions: You saw Dr. Higgs today                               _______________________________________________________________  Thank you for choosing [facility] to provide your oncology and hematology care.  To afford each patient quality time with our providers, please arrive at least 15 minutes before your scheduled appointment.  You need to re-schedule your appointment if you arrive 10 or more minutes late.  We strive to give you quality time with our providers, and arriving late affects you and other patients whose appointments are after yours.  Also, if you no show three or more times for appointments you may be dismissed from the clinic.  Again, thank you for choosing [facility] at [facility]. Our hope is that these requests will allow you access to exceptional care and in a timely manner. _______________________________________________________________  If you have questions after your visit, please contact our office at [phone] between the hours of 8:30 a.m. and 5:00 p.m. Voicemails left after 4:30 p.m. will not be returned until the following business day. _______________________________________________________________  For prescription refill requests, have your pharmacy contact our office. _______________________________________________________________  Recommendations made by the consultant and any test results will be sent to your referring physician. _______________________________________________________________ 

## 2018-10-08 ENCOUNTER — Other Ambulatory Visit (HOSPITAL_COMMUNITY): Payer: Self-pay | Admitting: Internal Medicine

## 2018-10-08 MED ORDER — FUROSEMIDE 20 MG PO TABS: 40.0000 mg | ORAL_TABLET | Freq: Every day | ORAL | 0 refills | Status: DC

## 2018-10-11 ENCOUNTER — Emergency Department (HOSPITAL_COMMUNITY): Payer: Medicare HMO

## 2018-10-11 ENCOUNTER — Encounter (HOSPITAL_COMMUNITY): Payer: Self-pay | Admitting: Emergency Medicine

## 2018-10-11 ENCOUNTER — Other Ambulatory Visit: Payer: Self-pay

## 2018-10-11 ENCOUNTER — Emergency Department (HOSPITAL_COMMUNITY)
Admission: EM | Admit: 2018-10-11 | Discharge: 2018-10-11 | Disposition: A | Discharge status: Home / Self Care | Payer: Medicare HMO | Source: Home / Self Care | Attending: Emergency Medicine | Admitting: Emergency Medicine

## 2018-10-11 DIAGNOSIS — K5641 Fecal impaction: Secondary | ICD-10-CM

## 2018-10-11 DIAGNOSIS — E876 Hypokalemia: Secondary | ICD-10-CM | POA: Diagnosis not present

## 2018-10-11 MED ORDER — HEPARIN SOD (PORK) LOCK FLUSH 100 UNIT/ML IV SOLN
INTRAVENOUS | Status: AC
  Filled 2018-10-11: qty 5

## 2018-10-11 MED ORDER — BISACODYL 10 MG RE SUPP
10.0000 mg | Freq: Once | RECTAL | Status: AC
  Administered 2018-10-11: 10 mg via RECTAL
  Filled 2018-10-11: qty 1

## 2018-10-11 NOTE — ED Notes (Signed)
Patient stated he is still unable to have a bowel movement

## 2018-10-11 NOTE — ED Provider Notes (Signed)
[facility] EMERGENCY DEPARTMENT Provider Note   CSN: [id number] Arrival date & time: 10/11/18  0849     History   Chief Complaint Chief Complaint  Patient presents with  . Fecal Impaction    HPI Gregory Hickman is a 67 y.o. male with history of metastatic lung cancer currently receiving chemo therapy presenting with his daughter for constipation.  Family reports has been complaining of rectal pain for the past 24 hours.  Family states that patient normally has 1 stool every 2 days and that she normally gives the patient prune juice to help with constipation.  Patient states that yesterday evening patient was complained of increasing rectal pain and was unable to have a bowel movement.  Family member states that she attempted to manually disimpact the patient herself and felt a large hard ball of stool in the rectum with soft stool oozing around the impaction.  Patient states that she was unable to disimpact the patient which is why they present today.  HPI  Past Medical History:  Diagnosis Date  . BPH (benign prostatic hyperplasia)   . Fracture   . Hypertension   . Metastasis ([place]) 09/02/2018    Patient Active Problem List   Diagnosis Date Noted  . 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 Metastasis ([place]) 09/02/2018  . 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 lung ([place]) 08/28/2018  . Anemia 04/09/2018  . Dark stools 04/09/2018  . RECTAL BLEEDING 05/31/2010  . ANOREXIA 05/31/2010  . WEIGHT LOSS, RECENT 05/31/2010    Past Surgical History:  Procedure Laterality Date  . APPENDECTOMY    . EYE SURGERY  1975  . HAND SURGERY    . HERNIA REPAIR  2015  . PORTACATH PLACEMENT Right 09/13/2018   Procedure: INSERTION PORT-A-CATH;  Surgeon: Aviva Signs, MD;  Location: [facility];  Service: General;  Laterality: Right;      Home Medications    Prior to Admission medications   Medication Sig Start Date End Date Taking? Authorizing Provider  amLODipine  (NORVASC) 10 MG tablet Take 1 tablet by mouth daily.   Yes [provider]  atorvastatin (LIPITOR) 40 MG tablet Take 1 tablet by mouth at bedtime.    Yes [provider]  CARBOPLATIN IV Inject into the vein every 21 ( twenty-one) days.   Yes [provider]  dexamethasone (DECADRON) 4 MG tablet Take 1 tab two times a day the day before [place] chemo, day of chemotherapy and day after chemotherapy for a total of 3 days. 09/27/18  Yes Higgs, Mathis Dad, MD  dutasteride (AVODART) 0.5 MG capsule Take 1 capsule by mouth daily.   Yes [provider]  folic acid (FOLVITE) 1 MG tablet Take 1 tablet (1 mg total) by mouth daily. 08/30/18  Yes Higgs, Mathis Dad, MD  furosemide (LASIX) 20 MG tablet Take 2 tablets (40 mg total) by mouth daily. 10/08/18  Yes Higgs, Mathis Dad, MD  lidocaine-prilocaine (EMLA) cream Apply small amount to port site and cover with plastic wrap one hour prior to appointment. 09/04/18  Yes Higgs, Mathis Dad, MD  lisinopril-hydrochlorothiazide (PRINZIDE,ZESTORETIC) 20-12.5 MG tablet Take 1 tablet by mouth 2 (two) times daily.    Yes [provider]  magic mouthwash SOLN Take 5 mLs by mouth 4 (four) times daily. Swish and spit 09/16/18  Yes Higgs, Mathis Dad, MD  morphine (MS CONTIN) 30 MG 12 hr tablet Take 1 tablet (30 mg total) by mouth every 12 (twelve) hours. 09/16/18  Yes  Higgs, Mathis Dad, MD  morphine (MSIR) 30 MG tablet TAKE  (1)  TABLET  EVERY FOUR HOURS AS NEEDED FOR SEVERE PAIN.    - MAY MAKE DROWSY - 10/07/18  Yes Higgs, Mathis Dad, MD  Pembrolizumab (KEYTRUDA IV) Inject into the vein every 21 ( twenty-one) days.   Yes [provider]  PEMEtrexed Disodium (ALIMTA IV) Inject into the vein every 21 ( twenty-one) days.   Yes [provider]  prochlorperazine (COMPAZINE) 10 MG tablet Take 1 tablet (10 mg total) by mouth every 6 (six) hours as needed (Nausea or vomiting). 09/04/18  Yes Higgs, Mathis Dad, MD  BANOPHEN 12.5 MG/5ML liquid  09/16/18   [provider]  nabumetone (RELAFEN) 750 MG tablet Take 1 tablet by mouth 2 (two) times daily.    [provider]  omeprazole (PRILOSEC) 20 MG capsule Take 1 capsule by mouth daily.    [provider]    Family History Family History  Problem Relation Age of Onset  . Colon cancer Neg Hx     Social History Social History   Tobacco Use  . Smoking status: Current Every Day Smoker    Packs/day: 0.50    Types: Cigarettes  . Smokeless tobacco: Former Systems developer    Types: Chew  . Tobacco comment: one pack of cigs last 3 days  Substance Use Topics  . Alcohol use: Yes    Comment: As of 07/30/18 (1) 40 ounce beer daily  . Drug use: Yes    Types: Marijuana, "Crack" cocaine    Comment: 1-2 times per month; marijuana, cocaine when he can afford it     Allergies   Patient has no known allergies.   Review of Systems Review of Systems  Constitutional: Positive for fatigue (Reports this is chronic due to chemo). Negative for chills and fever.  Respiratory: Negative.  Negative for cough and shortness of breath.   Cardiovascular: Negative.  Negative for chest pain.  Gastrointestinal: Positive for constipation. Negative for abdominal pain, blood in stool, diarrhea, nausea and vomiting.  Genitourinary: Negative for difficulty urinating, dysuria and hematuria.       Endorses rectal pain  Neurological: Negative.  Negative for dizziness, weakness and headaches.  All other systems reviewed and are negative.  Physical Exam Updated Vital Signs BP 98/68   Pulse 80   Temp 98.2 F (36.8 C) (Oral)   Resp 16   Ht 5\' 6"  (1.676 m)   Wt 46.7 kg   SpO2 95%   BMI 16.62 kg/m   Physical Exam  Constitutional: He appears well-developed. No distress.  Frail-appearing  HENT:  Head: Normocephalic and atraumatic.  Right Ear: External ear normal.  Left Ear: External ear normal.  Nose: Nose normal.  Mouth/Throat: Uvula is midline, oropharynx is clear and moist and mucous membranes are  normal.  Eyes: Pupils are equal, round, and reactive to light. EOM are normal.  Neck: Trachea normal and normal range of motion. Neck supple. No tracheal tenderness present. No tracheal deviation present.  Cardiovascular: Normal rate, regular rhythm and normal heart sounds.  Pulmonary/Chest: Effort normal and breath sounds normal. No respiratory distress. He exhibits no tenderness and no crepitus.  Abdominal: Soft. There is no tenderness. There is no rigidity, no rebound and no guarding.  Genitourinary:  Genitourinary Comments: Rectal examination and disimpaction chaperoned by Magda Paganini nurse tech.  Musculoskeletal: Normal range of motion.       Right lower leg: Normal.       Left lower leg: Normal.  Patient  is able to roll to his side without assistance.  Patient moving all extremities spontaneously and without distress.  No signs of injury noted.  Pedal pulses present and equal bilaterally.  Feet:  Right Foot:  Protective Sensation: 3 sites tested. 3 sites sensed.  Left Foot:  Protective Sensation: 3 sites tested. 3 sites sensed.  Neurological: He is alert. GCS eye subscore is 4. GCS verbal subscore is 5. GCS motor subscore is 6.  Patient is very pleasant, speech is clear and goal oriented, follows commands Major Cranial nerves without deficit, no facial droop Normal strength in upper and lower extremities bilaterally including dorsiflexion and plantar flexion, equal grip strength Sensation normal to light touch Moves extremities without ataxia, coordination intact  Skin: Skin is warm and dry.  Psychiatric: He has a normal mood and affect. His behavior is normal.   ED Treatments / Results  Labs (all labs ordered are listed, but only abnormal results are displayed) Labs Reviewed - No data to display  EKG None  Radiology Dg Abdomen 1 View  Result Date: 10/11/2018 CLINICAL DATA:  Constipation and rectal pain. EXAM: ABDOMEN - 1 VIEW COMPARISON:  PET-CT dated August 12, 2018.  FINDINGS: The bowel gas pattern is normal. Large amount of stool in the colon and rectum. No radio-opaque calculi or other significant radiographic abnormality are seen. No acute osseous abnormality. IMPRESSION: 1. Prominent stool throughout the colon and rectum, consistent with constipation. Correlate for fecal impaction. 2. No obstruction. Electronically Signed   By: Titus Dubin M.D.   On: 10/11/2018 10:00    Procedures Fecal disimpaction Date/Time: 10/11/2018 11:50 AM Performed by: Deliah Boston, PA-C Authorized by: Deliah Boston, PA-C  Consent: Verbal consent obtained. Consent given by: patient and guardian Patient understanding: patient states understanding of the procedure being performed Patient consent: the patient's understanding of the procedure matches consent given Procedure consent: procedure consent matches procedure scheduled Relevant documents: relevant documents present and verified Test results: test results available and properly labeled Imaging studies: imaging studies available Required items: required blood products, implants, devices, and special equipment available Patient identity confirmed: verbally with patient and arm band Time out: Immediately prior to procedure a "time out" was called to verify the correct patient, procedure, equipment, support staff and site/side marked as required. Preparation: Patient was prepped and draped in the usual sterile fashion. Local anesthesia used: no  Anesthesia: Local anesthesia used: no  Sedation: Patient sedated: no  Patient tolerance: Patient tolerated the procedure well with no immediate complications Comments: Large amount of hard stool removed from the rectal vault.    (including critical care time)  Medications Ordered in ED Medications  bisacodyl (DULCOLAX) suppository 10 mg (10 mg Rectal Given 10/11/18 1004)  bisacodyl (DULCOLAX) suppository 10 mg (10 mg Rectal Given 10/11/18 1157)      Initial Impression / Assessment and Plan / ED Course  I have reviewed the triage vital signs and the nursing notes.  Pertinent labs & imaging results that were available during my care of the patient were reviewed by me and considered in my medical decision making (see chart for details).     67 year old male with history of metastatic cancer presenting for fecal impaction.  Patient is often constipated however has had increasing rectal pain and need for bowel movement over past 24 hours.  Patient without systemic symptoms/complaints.  Plain film obtained showing prominent stool burden. First Fleet enema given by nursing staff.  Plan future disimpaction. Patient seen and evaluated by Dr.  Lacinda Axon who agrees with work-up and plan of care. ---------------------------------------------------------------------------------------------------------------- 11:30 AM: Fecal disimpaction performed by myself, large amount of hard stool removed from the fecal vault.  Discussed with Dr. Lacinda Axon, will give a second Fleet enema and discharge. --------------------------------------------------------------------------------------------------------------------- Second Fleet enema given by nursing staff.  Patient endorses relief of symptoms following disimpaction. He states that he is feeling better and wishes to be discharged.  I have discussed plan of care with patient's family member at length, encouraged over-the-counter use of MiraLAX and increasing water intake.  Patient's daughter states understanding.  I also strongly encouraged that the patient be seen by his primary care provider on Monday for follow-up.  At discharge patient is alert and oriented, afebrile, not tachycardic, not hypotensive, well-appearing/pleasant and in no acute distress.  At this time there does not appear to be any evidence of an acute emergency medical condition and the patient appears stable for discharge with appropriate outpatient  follow up. Diagnosis was discussed with patient and family who verbalize understanding of care plan and is agreeable to discharge. I have discussed return precautions with patient and family who verbalize understanding of return precautions. Patient and family member strongly encouraged to follow-up with their PCP on Monday. All questions answered.  Patient was seen and evaluated by Dr. Lacinda Axon during this visit who agrees with plan of care, discharge and PCP follow-up at this time.  Note: Portions of this report may have been transcribed using voice recognition software. Every effort was made to ensure accuracy; however, inadvertent computerized transcription errors may still be present. Final Clinical Impressions(s) / ED Diagnoses   Final diagnoses:  Fecal impaction [facility])    ED Discharge Orders    None       Gari Crown 10/11/18 1743    Nat Christen, MD 10/15/18 [phone]

## 2018-10-11 NOTE — ED Triage Notes (Signed)
Family reports pt has metastatic cancer and takes morphine for pain. Last chemo was Monday. Pt has had fecal impaction since yesterday with leakage around stool in rectum. Pt in pain at rectum when sitting.

## 2018-10-11 NOTE — ED Notes (Signed)
Patient transported to X-ray 

## 2018-10-11 NOTE — ED Notes (Signed)
EDP at bedside updating patient and family. 

## 2018-10-11 NOTE — ED Notes (Signed)
Patient has not had any bowel movements since his disimpaction. He stated he has been sleeping and that he is feeling much better, notified the provider

## 2018-10-11 NOTE — Discharge Instructions (Signed)
You have been diagnosed today with fecal impaction. At this time there does not appear to be the presence of an emergent medical condition, however there is always the potential for conditions to change. Please read and follow the below instructions.  Please return to the Emergency Department immediately for any new or worsening symptoms. Please be sure to follow up with your Primary Care Provider on Monday regarding your visit today; please call their office to schedule an appointment even if you are feeling better for a follow-up visit. Please be sure that the patient keeps up his water intake to help with his constipation.  You may also use over-the-counter medications such as MiraLAX to help with patient's constipation.  Contact a health care provider if: You have ongoing pain in your rectum. You need to use an enema or a suppository more than 2 times a week. You have rectal bleeding. You continue to have problems. The problems may include not being able to go to the bathroom and long-term (chronic) constipation. You have pain in your abdomen. You have thin, pencil-like stools. Get help right away if: You have black or tarry stools.  Please read the additional information packets attached to your discharge summary.  Do not take your medicine if  develop an itchy rash, swelling in your mouth or lips, or difficulty breathing.

## 2018-10-13 ENCOUNTER — Encounter (HOSPITAL_COMMUNITY): Payer: Self-pay

## 2018-10-13 ENCOUNTER — Other Ambulatory Visit: Payer: Self-pay

## 2018-10-13 ENCOUNTER — Inpatient Hospital Stay (HOSPITAL_COMMUNITY)
Admission: EM | Admit: 2018-10-13 | Discharge: 2018-10-17 | DRG: 640 | Disposition: A | Discharge status: Hospice | Payer: Medicare HMO | Attending: Internal Medicine | Admitting: Internal Medicine

## 2018-10-13 ENCOUNTER — Other Ambulatory Visit (HOSPITAL_COMMUNITY): Payer: Self-pay

## 2018-10-13 DIAGNOSIS — R4182 Altered mental status, unspecified: Secondary | ICD-10-CM | POA: Diagnosis present

## 2018-10-13 DIAGNOSIS — N400 Enlarged prostate without lower urinary tract symptoms: Secondary | ICD-10-CM | POA: Diagnosis present

## 2018-10-13 DIAGNOSIS — Z66 Do not resuscitate: Secondary | ICD-10-CM | POA: Diagnosis present

## 2018-10-13 DIAGNOSIS — F17210 Nicotine dependence, cigarettes, uncomplicated: Secondary | ICD-10-CM | POA: Diagnosis present

## 2018-10-13 DIAGNOSIS — Z515 Encounter for palliative care: Secondary | ICD-10-CM | POA: Diagnosis not present

## 2018-10-13 DIAGNOSIS — F1290 Cannabis use, unspecified, uncomplicated: Secondary | ICD-10-CM | POA: Diagnosis present

## 2018-10-13 DIAGNOSIS — F1490 Cocaine use, unspecified, uncomplicated: Secondary | ICD-10-CM | POA: Diagnosis present

## 2018-10-13 DIAGNOSIS — E871 Hypo-osmolality and hyponatremia: Secondary | ICD-10-CM | POA: Diagnosis present

## 2018-10-13 DIAGNOSIS — Z79899 Other long term (current) drug therapy: Secondary | ICD-10-CM | POA: Diagnosis not present

## 2018-10-13 DIAGNOSIS — I10 Essential (primary) hypertension: Secondary | ICD-10-CM | POA: Diagnosis present

## 2018-10-13 DIAGNOSIS — Z9221 Personal history of antineoplastic chemotherapy: Secondary | ICD-10-CM

## 2018-10-13 DIAGNOSIS — K5903 Drug induced constipation: Secondary | ICD-10-CM | POA: Diagnosis present

## 2018-10-13 DIAGNOSIS — E876 Hypokalemia: Secondary | ICD-10-CM | POA: Diagnosis present

## 2018-10-13 DIAGNOSIS — C787 Secondary malignant neoplasm of liver and intrahepatic bile duct: Secondary | ICD-10-CM | POA: Diagnosis present

## 2018-10-13 DIAGNOSIS — Z7189 Other specified counseling: Secondary | ICD-10-CM | POA: Diagnosis not present

## 2018-10-13 DIAGNOSIS — Z923 Personal history of irradiation: Secondary | ICD-10-CM

## 2018-10-13 DIAGNOSIS — N179 Acute kidney failure, unspecified: Secondary | ICD-10-CM | POA: Diagnosis present

## 2018-10-13 DIAGNOSIS — C3412 Malignant neoplasm of upper lobe, left bronchus or lung: Secondary | ICD-10-CM | POA: Diagnosis present

## 2018-10-13 DIAGNOSIS — R64 Cachexia: Secondary | ICD-10-CM | POA: Diagnosis present

## 2018-10-13 DIAGNOSIS — C7931 Secondary malignant neoplasm of brain: Secondary | ICD-10-CM | POA: Diagnosis present

## 2018-10-13 DIAGNOSIS — Z681 Body mass index (BMI) 19 or less, adult: Secondary | ICD-10-CM | POA: Diagnosis not present

## 2018-10-13 DIAGNOSIS — R531 Weakness: Secondary | ICD-10-CM

## 2018-10-13 DIAGNOSIS — G9340 Encephalopathy, unspecified: Secondary | ICD-10-CM | POA: Diagnosis not present

## 2018-10-13 DIAGNOSIS — G893 Neoplasm related pain (acute) (chronic): Secondary | ICD-10-CM | POA: Diagnosis present

## 2018-10-13 DIAGNOSIS — C799 Secondary malignant neoplasm of unspecified site: Secondary | ICD-10-CM | POA: Diagnosis not present

## 2018-10-13 DIAGNOSIS — E43 Unspecified severe protein-calorie malnutrition: Secondary | ICD-10-CM | POA: Diagnosis present

## 2018-10-13 DIAGNOSIS — C3490 Malignant neoplasm of unspecified part of unspecified bronchus or lung: Secondary | ICD-10-CM | POA: Diagnosis not present

## 2018-10-13 DIAGNOSIS — E860 Dehydration: Secondary | ICD-10-CM | POA: Diagnosis present

## 2018-10-13 DIAGNOSIS — R569 Unspecified convulsions: Secondary | ICD-10-CM

## 2018-10-13 DIAGNOSIS — R627 Adult failure to thrive: Secondary | ICD-10-CM | POA: Diagnosis not present

## 2018-10-13 LAB — URINALYSIS, ROUTINE W REFLEX MICROSCOPIC
BACTERIA UA: NONE SEEN
Bilirubin Urine: NEGATIVE
Glucose, UA: NEGATIVE mg/dL
Ketones, ur: NEGATIVE mg/dL
Leukocytes, UA: NEGATIVE
Nitrite: NEGATIVE
Protein, ur: NEGATIVE mg/dL
Specific Gravity, Urine: 1.005 (ref 1.005–1.030)
pH: 7 (ref 5.0–8.0)

## 2018-10-13 LAB — CBC WITH DIFFERENTIAL/PLATELET
Abs Immature Granulocytes: 0.08 10*3/uL — ABNORMAL HIGH (ref 0.00–0.07)
Basophils Absolute: 0 10*3/uL (ref 0.0–0.1)
Basophils Relative: 0 %
EOS ABS: 0 10*3/uL (ref 0.0–0.5)
Eosinophils Relative: 0 %
HCT: 30 % — ABNORMAL LOW (ref 39.0–52.0)
Hemoglobin: 10.2 g/dL — ABNORMAL LOW (ref 13.0–17.0)
Immature Granulocytes: 3 %
Lymphocytes Relative: 9 %
Lymphs Abs: 0.2 10*3/uL — ABNORMAL LOW (ref 0.7–4.0)
MCH: 31 pg (ref 26.0–34.0)
MCHC: 34 g/dL (ref 30.0–36.0)
MCV: 91.2 fL (ref 80.0–100.0)
Monocytes Absolute: 0.1 10*3/uL (ref 0.1–1.0)
Monocytes Relative: 4 %
Neutro Abs: 2.1 10*3/uL (ref 1.7–7.7)
Neutrophils Relative %: 84 %
Platelets: 229 10*3/uL (ref 150–400)
RBC: 3.29 MIL/uL — ABNORMAL LOW (ref 4.22–5.81)
RDW: 14.3 % (ref 11.5–15.5)
WBC: 2.5 10*3/uL — ABNORMAL LOW (ref 4.0–10.5)
nRBC: 0 % (ref 0.0–0.2)

## 2018-10-13 LAB — MAGNESIUM: Magnesium: 1.9 mg/dL (ref 1.7–2.4)

## 2018-10-13 LAB — I-STAT CG4 LACTIC ACID, ED: Lactic Acid, Venous: 1.28 mmol/L (ref 0.5–1.9)

## 2018-10-13 LAB — BASIC METABOLIC PANEL
Anion gap: 11 (ref 5–15)
BUN: 22 mg/dL (ref 8–23)
CO2: 38 mmol/L — ABNORMAL HIGH (ref 22–32)
Calcium: 11.2 mg/dL — ABNORMAL HIGH (ref 8.9–10.3)
Chloride: 78 mmol/L — ABNORMAL LOW (ref 98–111)
Creatinine, Ser: 1.25 mg/dL — ABNORMAL HIGH (ref 0.61–1.24)
GFR calc Af Amer: >60 mL/min (ref >60)
GFR calc non Af Amer: 59 mL/min — ABNORMAL LOW (ref >60)
Glucose, Bld: 92 mg/dL (ref 70–99)
Potassium: 2.3 mmol/L — CL (ref 3.5–5.1)
Sodium: 127 mmol/L — ABNORMAL LOW (ref 135–145)

## 2018-10-13 LAB — CBG MONITORING, ED: Glucose-Capillary: 102 mg/dL — ABNORMAL HIGH (ref 70–99)

## 2018-10-13 LAB — CORTISOL: CORTISOL PLASMA: 24.6 ug/dL

## 2018-10-13 MED ORDER — SODIUM CHLORIDE 0.9 % IV BOLUS
500.0000 mL | Freq: Once | INTRAVENOUS | Status: AC
  Administered 2018-10-13: 500 mL via INTRAVENOUS

## 2018-10-13 MED ORDER — PANTOPRAZOLE SODIUM 40 MG PO TBEC
40.0000 mg | DELAYED_RELEASE_TABLET | Freq: Every day | ORAL | Status: DC
  Administered 2018-10-14 – 2018-10-17 (×4): 40 mg via ORAL
  Filled 2018-10-13 (×4): qty 1

## 2018-10-13 MED ORDER — POTASSIUM CHLORIDE 20 MEQ/15ML (10%) PO SOLN: 20.0000 meq | Freq: Once | ORAL | Status: DC

## 2018-10-13 MED ORDER — MORPHINE SULFATE (PF) 2 MG/ML IV SOLN
1.0000 mg | INTRAVENOUS | Status: DC | PRN
  Administered 2018-10-13: 1 mg via INTRAVENOUS
  Filled 2018-10-13: qty 1

## 2018-10-13 MED ORDER — DUTASTERIDE 0.5 MG PO CAPS
0.5000 mg | ORAL_CAPSULE | Freq: Every day | ORAL | Status: DC
  Administered 2018-10-14 – 2018-10-17 (×4): 0.5 mg via ORAL
  Filled 2018-10-13 (×6): qty 1

## 2018-10-13 MED ORDER — ONDANSETRON HCL 4 MG PO TABS: 4.0000 mg | ORAL_TABLET | Freq: Four times a day (QID) | ORAL | Status: DC | PRN

## 2018-10-13 MED ORDER — POTASSIUM CHLORIDE 10 MEQ/100ML IV SOLN
10.0000 meq | INTRAVENOUS | Status: AC
  Administered 2018-10-13 (×3): 10 meq via INTRAVENOUS
  Filled 2018-10-13 (×3): qty 100

## 2018-10-13 MED ORDER — ENSURE ENLIVE PO LIQD
237.0000 mL | Freq: Three times a day (TID) | ORAL | Status: DC
  Administered 2018-10-13 – 2018-10-17 (×5): 237 mL via ORAL
  Filled 2018-10-13 (×3): qty 237

## 2018-10-13 MED ORDER — ACETAMINOPHEN 650 MG RE SUPP: 650.0000 mg | Freq: Four times a day (QID) | RECTAL | Status: DC | PRN

## 2018-10-13 MED ORDER — SODIUM CHLORIDE 0.9 % IV SOLN: INTRAVENOUS | Status: DC

## 2018-10-13 MED ORDER — ACETAMINOPHEN 325 MG PO TABS: 650.0000 mg | ORAL_TABLET | Freq: Four times a day (QID) | ORAL | Status: DC | PRN

## 2018-10-13 MED ORDER — POTASSIUM CHLORIDE IN NACL 40-0.9 MEQ/L-% IV SOLN
INTRAVENOUS | Status: DC
  Administered 2018-10-13 – 2018-10-15 (×5): 75 mL/h via INTRAVENOUS
  Filled 2018-10-13 (×2): qty 1000

## 2018-10-13 MED ORDER — ONDANSETRON HCL 4 MG/2ML IJ SOLN: 4.0000 mg | Freq: Four times a day (QID) | INTRAMUSCULAR | Status: DC | PRN

## 2018-10-13 MED ORDER — LINACLOTIDE 145 MCG PO CAPS
145.0000 ug | ORAL_CAPSULE | Freq: Every day | ORAL | Status: DC
  Administered 2018-10-14 – 2018-10-17 (×3): 145 ug via ORAL
  Filled 2018-10-13 (×4): qty 1

## 2018-10-13 MED ORDER — HEPARIN SODIUM (PORCINE) 5000 UNIT/ML IJ SOLN
5000.0000 [IU] | Freq: Three times a day (TID) | INTRAMUSCULAR | Status: DC
  Administered 2018-10-13 – 2018-10-16 (×8): 5000 [IU] via SUBCUTANEOUS
  Filled 2018-10-13 (×8): qty 1

## 2018-10-13 MED ORDER — FOLIC ACID 1 MG PO TABS
1.0000 mg | ORAL_TABLET | Freq: Every day | ORAL | Status: DC
  Administered 2018-10-14 – 2018-10-17 (×3): 1 mg via ORAL
  Filled 2018-10-13 (×4): qty 1

## 2018-10-13 MED ORDER — HYDROCORTISONE NA SUCCINATE PF 100 MG IJ SOLR
50.0000 mg | Freq: Three times a day (TID) | INTRAMUSCULAR | Status: DC
  Administered 2018-10-14 (×3): 50 mg via INTRAVENOUS
  Filled 2018-10-13 (×3): qty 2

## 2018-10-13 MED ORDER — MORPHINE SULFATE ER 15 MG PO TBCR
15.0000 mg | EXTENDED_RELEASE_TABLET | Freq: Two times a day (BID) | ORAL | Status: DC
  Administered 2018-10-13 – 2018-10-14 (×2): 15 mg via ORAL
  Filled 2018-10-13 (×2): qty 1

## 2018-10-13 MED ORDER — PRO-STAT SUGAR FREE PO LIQD
30.0000 mL | Freq: Two times a day (BID) | ORAL | Status: DC
  Administered 2018-10-13 – 2018-10-17 (×7): 30 mL via ORAL
  Filled 2018-10-13 (×7): qty 30

## 2018-10-13 NOTE — ED Triage Notes (Addendum)
Pt brought in by sister due to weakness this morning. Reports he is normally able to walk and was normal yesterday. He woke up this morning at 6 and then approx 8 am his sister was trying to feed him and food was running out of mouth and unable to stand. Family had to lift into vehicle. Family reports metastatic cancer. Pt was complaining of vision last night. Pt with weak bilateral grip strength. Able to follow some commands

## 2018-10-13 NOTE — ED Notes (Signed)
Patient voiding multiple times, discomfort reported from patient with use of urinal. Condom cath applied, patient tolerated well.

## 2018-10-13 NOTE — ED Notes (Signed)
CRITICAL VALUE ALERT  Critical Value:  Potassium 2.3  Date & Time Notied:  10/13/2018 @ 1031  Provider Notified: Dr Eulis Foster  Orders Received/Actions taken: Orders to be given

## 2018-10-13 NOTE — ED Provider Notes (Signed)
[facility] EMERGENCY DEPARTMENT Provider Note   CSN: [id number] Arrival date & time: 10/13/18  4098     History   Chief Complaint Chief Complaint  Patient presents with  . Altered Mental Status    HPI Gregory Hickman is a 67 y.o. male.  HPI   Patient presents for weakness started yesterday and was coming by decreased oral intake.  He was in the ED 2 days ago and had fecal disimpaction.  Today he is much weaker, cannot eat at all, cannot walk at all.  Yesterday he was able to walk.  He is here with his sister who is his caregiver, at her home.  He has had chemotherapy 6 days ago.  Status post radiation therapy to brain, chest and back.  He was given blood pressure medicine earlier in the week for swelling in his legs.  His sister stopped giving the blood pressure medicine because his blood pressure was low.  Level 5 caveat-altered mental status  Past Medical History:  Diagnosis Date  . BPH (benign prostatic hyperplasia)   . Fracture   . Hypertension   . Metastasis ([place]) 09/02/2018    Patient Active Problem List   Diagnosis Date Noted  . 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 Metastasis ([place]) 09/02/2018  . 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 lung ([place]) 08/28/2018  . Anemia 04/09/2018  . Dark stools 04/09/2018  . RECTAL BLEEDING 05/31/2010  . ANOREXIA 05/31/2010  . WEIGHT LOSS, RECENT 05/31/2010    Past Surgical History:  Procedure Laterality Date  . APPENDECTOMY    . EYE SURGERY  1975  . HAND SURGERY    . HERNIA REPAIR  2015  . PORTACATH PLACEMENT Right 09/13/2018   Procedure: INSERTION PORT-A-CATH;  Surgeon: Aviva Signs, MD;  Location: [facility];  Service: General;  Laterality: Right;        Home Medications    Prior to Admission medications   Medication Sig Start Date End Date Taking? Authorizing Provider  amLODipine (NORVASC) 10 MG tablet Take 1 tablet by mouth daily.    [provider]  atorvastatin (LIPITOR)  40 MG tablet Take 1 tablet by mouth at bedtime.     [provider]  BANOPHEN 12.5 MG/5ML liquid  09/16/18   [provider]  CARBOPLATIN IV Inject into the vein every 21 ( twenty-one) days.    [provider]  dexamethasone (DECADRON) 4 MG tablet Take 1 tab two times a day the day before Alimta chemo, day of chemotherapy and day after chemotherapy for a total of 3 days. 09/27/18   Higgs, Mathis Dad, MD  dutasteride (AVODART) 0.5 MG capsule Take 1 capsule by mouth daily.    [provider]  folic acid (FOLVITE) 1 MG tablet Take 1 tablet (1 mg total) by mouth daily. 08/30/18   Higgs, Mathis Dad, MD  furosemide (LASIX) 20 MG tablet Take 2 tablets (40 mg total) by mouth daily. 10/08/18   Higgs, Mathis Dad, MD  lidocaine-prilocaine (EMLA) cream Apply small amount to port site and cover with plastic wrap one hour prior to appointment. 09/04/18   Higgs, Mathis Dad, MD  lisinopril-hydrochlorothiazide (PRINZIDE,ZESTORETIC) 20-12.5 MG tablet Take 1 tablet by mouth 2 (two) times daily.     [provider]  magic mouthwash SOLN Take 5 mLs by mouth 4 (four) times daily. Swish and spit 09/16/18   Higgs, Mathis Dad, MD  morphine (MS CONTIN) 30 MG 12 hr tablet Take 1 tablet (30 mg total)  by mouth every 12 (twelve) hours. 09/16/18   Higgs, Mathis Dad, MD  morphine (MSIR) 30 MG tablet TAKE  (1)  TABLET  EVERY FOUR HOURS AS NEEDED FOR SEVERE PAIN.    - MAY MAKE DROWSY - 10/07/18   Higgs, Mathis Dad, MD  nabumetone (RELAFEN) 750 MG tablet Take 1 tablet by mouth 2 (two) times daily.    [provider]  omeprazole (PRILOSEC) 20 MG capsule Take 1 capsule by mouth daily.    [provider]  Pembrolizumab (KEYTRUDA IV) Inject into the vein every 21 ( twenty-one) days.    [provider]  PEMEtrexed Disodium (ALIMTA IV) Inject into the vein every 21 ( twenty-one) days.    [provider]  prochlorperazine (COMPAZINE) 10 MG tablet Take 1 tablet (10 mg total) by mouth every 6 (six)  hours as needed (Nausea or vomiting). 09/04/18   Higgs, Mathis Dad, MD    Family History Family History  Problem Relation Age of Onset  . Colon cancer Neg Hx     Social History Social History   Tobacco Use  . Smoking status: Current Every Day Smoker    Packs/day: 0.50    Types: Cigarettes  . Smokeless tobacco: Former Systems developer    Types: Chew  . Tobacco comment: one pack of cigs last 3 days  Substance Use Topics  . Alcohol use: Yes    Comment: As of 07/30/18 (1) 40 ounce beer daily  . Drug use: Yes    Types: Marijuana, "Crack" cocaine    Comment: 1-2 times per month; marijuana, cocaine when he can afford it     Allergies   Patient has no known allergies.   Review of Systems Review of Systems  Unable to perform ROS: Mental status change     Physical Exam Updated Vital Signs BP 100/62   Pulse 72   Temp 99 F (37.2 C) (Rectal)   Resp 15   Ht 5\' 6"  (1.676 m)   Wt 43.1 kg   SpO2 100%   BMI 15.33 kg/m   Physical Exam  Constitutional: He appears well-developed. He appears distressed (He is uncomfortable).  Cachectic  HENT:  Head: Normocephalic and atraumatic.  Right Ear: External ear normal.  Left Ear: External ear normal.  Eyes: Pupils are equal, round, and reactive to light. Conjunctivae and EOM are normal. Right eye exhibits no discharge. Left eye exhibits no discharge.  Opaque anterior left eye.  Disconjugate gaze  Neck: Normal range of motion and phonation normal. Neck supple.  Cardiovascular: Normal rate, regular rhythm and normal heart sounds.  Pulmonary/Chest: Effort normal and breath sounds normal. No stridor. No respiratory distress. He exhibits no bony tenderness.  Abdominal: Soft. There is no tenderness.  Musculoskeletal: Normal range of motion. He exhibits no edema or deformity.  Neurological: He is alert. No cranial nerve deficit or sensory deficit. He exhibits normal muscle tone. Coordination normal.  Skin: Skin is warm, dry and intact.  Psychiatric: He  has a normal mood and affect. His behavior is normal.  Nursing note and vitals reviewed.    ED Treatments / Results  Labs (all labs ordered are listed, but only abnormal results are displayed) Labs Reviewed  BASIC METABOLIC PANEL - Abnormal; Notable for the following components:      Result Value   Sodium 127 (*)    Potassium 2.3 (*)    Chloride 78 (*)    CO2 38 (*)    Creatinine, Ser 1.25 (*)    Calcium 11.2 (*)  GFR calc non Af Amer 59 (*)    All other components within normal limits  CBC WITH DIFFERENTIAL/PLATELET - Abnormal; Notable for the following components:   WBC 2.5 (*)    RBC 3.29 (*)    Hemoglobin 10.2 (*)    HCT 30.0 (*)    Lymphs Abs 0.2 (*)    Abs Immature Granulocytes 0.08 (*)    All other components within normal limits  URINALYSIS, ROUTINE W REFLEX MICROSCOPIC - Abnormal; Notable for the following components:   Color, Urine STRAW (*)    Hgb urine dipstick SMALL (*)    All other components within normal limits  CBG MONITORING, ED - Abnormal; Notable for the following components:   Glucose-Capillary 102 (*)    All other components within normal limits  MAGNESIUM  I-STAT CG4 LACTIC ACID, ED  I-STAT CG4 LACTIC ACID, ED    EKG None    Date: 10/13/18  Rate: 75  Rhythm: normal sinus rhythm  QRS Axis: normal  PR and QT Intervals: normal  ST/T Wave abnormalities: normal  PR and QRS Conduction Disutrbances:Borderline prolonged QT  Narrative Interpretation:   Old EKG Reviewed: unchanged   Radiology No results found.  Procedures .Critical Care Performed by: Daleen Bo, MD Authorized by: Daleen Bo, MD   Critical care provider statement:    Critical care time (minutes):  50   Critical care start time:  10/13/2018 9:20 AM   Critical care end time:  10/13/2018 11:48 AM   Critical care time was exclusive of:  Separately billable procedures and treating other patients   Critical care was necessary to treat or prevent imminent or  life-threatening deterioration of the following conditions:  Metabolic crisis   Critical care was time spent personally by me on the following activities:  Blood draw for specimens, development of treatment plan with patient or surrogate, discussions with consultants, evaluation of patient's response to treatment, examination of patient, obtaining history from patient or surrogate, ordering and performing treatments and interventions, ordering and review of laboratory studies, pulse oximetry, re-evaluation of patient's condition, review of old charts and ordering and review of radiographic studies   (including critical care time)  Medications Ordered in ED Medications  potassium chloride 10 mEq in 100 mL IVPB (10 mEq Intravenous New Bag/Given 10/13/18 1119)  sodium chloride 0.9 % bolus 500 mL (0 mLs Intravenous Stopped 10/13/18 1041)     Initial Impression / Assessment and Plan / ED Course  I have reviewed the triage vital signs and the nursing notes.  Pertinent labs & imaging results that were available during my care of the patient were reviewed by me and considered in my medical decision making (see chart for details).  Clinical Course as of Oct 14 1147  Sun Oct 13, 2018  1137 Normal  Urinalysis, Routine w reflex microscopic(!) [EW]  1137 Normal  I-Stat CG4 Lactic Acid, ED [EW]  1137 Normal except sodium low, potassium low, chloride low, CO2 high, creatinine high, calcium high, GFR low  Basic metabolic panel(!!) [EW]  2229 Normal  Magnesium [EW]  1138 Normal except white count low, hemoglobin low  CBC with Differential(!) [EW]    Clinical Course User Index [EW] Daleen Bo, MD     Patient Vitals for the past 24 hrs:  BP Temp Temp src Pulse Resp SpO2 Height Weight  10/13/18 1104 100/62 - - 72 15 100 % - -  10/13/18 1030 (!) 102/57 - - 87 14 98 % - -  10/13/18 1015 120/63 - Marland Kitchen Marland Kitchen)  59 15 99 % - -  10/13/18 1000 100/63 - - 63 10 100 % - -  10/13/18 0945 99/61 - - 65 14 100 % -  -  10/13/18 0938 (!) 89/56 - - - - - - -  10/13/18 0930 (!) 82/53 - - 65 14 100 % - -  10/13/18 0916 - 99 F (37.2 C) Rectal - - - - -  10/13/18 0905 (!) 96/59 - - 75 12 93 % - -  10/13/18 0903 - - - - - - 5\' 6"  (1.676 m) 43.1 kg    11:36 AM Reevaluation with update and discussion. After initial assessment and treatment, an updated evaluation reveals no change in clinical status.  Findings discussed with patient's sister, all questions were answered. Daleen Bo   Medical Decision Making: Weakness with marked hypokalemia.  Also likely dehydration with bump in creatinine from baseline.  Patient with metastatic cancer, who may also have worsening brain edema from known metastases.  The patient appears to be near end-of-life and likely will need palliative consultation while hospitalized.  He will need to be hospitalized for treatment at this time for severe hypokalemia.  CRITICAL CARE-Yes Performed by: Daleen Bo  Nursing Notes Reviewed/ Care Coordinated Applicable Imaging Reviewed Interpretation of Laboratory Data incorporated into ED treatment  12:11 PM-Consult complete with specialist. Patient case explained and discussed.  He agrees to admit patient for further evaluation and treatment. Call ended at 12:18 PM  Plan: Admit  Final Clinical Impressions(s) / ED Diagnoses   Final diagnoses:  Hypokalemia  Weakness  Metastatic cancer [facility])    ED Discharge Orders    None       Daleen Bo, MD 10/13/18 1225

## 2018-10-13 NOTE — H&P (Signed)
History and Physical    Gregory Hickman:[id number] DOB: Aug 23, 1951 DOA: 10/13/2018  Referring MD/NP/PA: Dr. Eulis Foster. PCP: Joyice Faster, FNP  Patient coming from: home  Chief Complaint: Generalized weakness and lethargy  HPI: Gregory Hickman is a 67 y.o. male with a past medical history significant for BPH, non-small cell lung cancer (affecting left alone, stage IV), HTN, severe protein calorie malnutrition and recent visit to ED due to constipation from narcotics use; who presented today to the hospital secondary to generalized weakness, altered mental status and poor oral intake.  Per family reports the last 2 days and not been eating or drinking anything and today was just too weak to even stand.  The change in his mentation is mainly increased somnolence and lethargy.  Patient has been experiencing intermittent episode of constant pain and back pain (which is not new and associated with his history of lung cancer).  Patient denies any dysuria, nausea, vomiting, hematochezia, melena focal weakness, dysuria, hematuria, fever, chills or any other complaints.  In the ED work-up demonstrated severe hypokalemia, acute kidney injury, dehydration and hypercalcemia.  [facility] has been consulted to admit patient for further evaluation and management.  Past Medical/Surgical History: Past Medical History:  Diagnosis Date  . BPH (benign prostatic hyperplasia)   . Fracture   . Hypertension   . Metastasis ([place]) 09/02/2018    Past Surgical History:  Procedure Laterality Date  . APPENDECTOMY    . EYE SURGERY  1975  . HAND SURGERY    . HERNIA REPAIR  2015  . PORTACATH PLACEMENT Right 09/13/2018   Procedure: INSERTION PORT-A-CATH;  Surgeon: Aviva Signs, MD;  Location: [facility];  Service: General;  Laterality: Right;    Social History:  reports that he has been smoking cigarettes. He has been smoking about 0.50 packs per day. He has quit using smokeless tobacco.  His smokeless tobacco use  included chew. He reports that he drinks alcohol. He reports that he has current or past drug history. Drugs: Marijuana and "Crack" cocaine.  Allergies: No Known Allergies  Family History:  Family History  Problem Relation Age of Onset  . Colon cancer Neg Hx     Prior to Admission medications   Medication Sig Start Date End Date Taking? Authorizing Provider  amLODipine (NORVASC) 10 MG tablet Take 1 tablet by mouth daily.   Yes [provider]  atorvastatin (LIPITOR) 40 MG tablet Take 1 tablet by mouth at bedtime.    Yes [provider]  CARBOPLATIN IV Inject into the vein every 21 ( twenty-one) days.   Yes [provider]  dexamethasone (DECADRON) 4 MG tablet Take 1 tab two times a day the day before Alimta chemo, day of chemotherapy and day after chemotherapy for a total of 3 days. 09/27/18  Yes Higgs, Mathis Dad, MD  dutasteride (AVODART) 0.5 MG capsule Take 1 capsule by mouth daily.   Yes [provider]  folic acid (FOLVITE) 1 MG tablet Take 1 tablet (1 mg total) by mouth daily. 08/30/18  Yes Higgs, Mathis Dad, MD  furosemide (LASIX) 20 MG tablet Take 2 tablets (40 mg total) by mouth daily. 10/08/18  Yes Higgs, Mathis Dad, MD  lidocaine-prilocaine (EMLA) cream Apply small amount to port site and cover with plastic wrap one hour prior to appointment. 09/04/18  Yes Higgs, Mathis Dad, MD  lisinopril-hydrochlorothiazide (PRINZIDE,ZESTORETIC) 20-12.5 MG tablet Take 1 tablet by mouth 2 (two) times daily.    Yes [provider]  magic mouthwash SOLN Take 5 mLs  by mouth 4 (four) times daily. Swish and spit 09/16/18  Yes Higgs, Mathis Dad, MD  morphine (MS CONTIN) 30 MG 12 hr tablet Take 1 tablet (30 mg total) by mouth every 12 (twelve) hours. 09/16/18  Yes Higgs, Mathis Dad, MD  morphine (MSIR) 30 MG tablet TAKE  (1)  TABLET  EVERY FOUR HOURS AS NEEDED FOR SEVERE PAIN.    - MAY MAKE DROWSY - 10/07/18  Yes Higgs, Mathis Dad, MD  nabumetone (RELAFEN) 750 MG tablet Take 1 tablet by mouth 2  (two) times daily.   Yes [provider]  omeprazole (PRILOSEC) 20 MG capsule Take 1 capsule by mouth daily.   Yes [provider]  Pembrolizumab (KEYTRUDA IV) Inject into the vein every 21 ( twenty-one) days.   Yes [provider]  PEMEtrexed Disodium (ALIMTA IV) Inject into the vein every 21 ( twenty-one) days.   Yes [provider]  prochlorperazine (COMPAZINE) 10 MG tablet Take 1 tablet (10 mg total) by mouth every 6 (six) hours as needed (Nausea or vomiting). 09/04/18  Yes Higgs, Mathis Dad, MD  BANOPHEN 12.5 MG/5ML liquid Take 12.5 mg by mouth.  09/16/18   [provider]   Review of Systems:  Negative except as otherwise mentioned in HPI.  Physical Exam: Vitals:   10/13/18 1333 10/13/18 1411 10/13/18 1439 10/13/18 1510  BP: 105/64 116/66 118/67 113/70  Pulse: 74 73 74 75  Resp: 15 12 14 18   Temp: 98.1 F (36.7 C)   (!) 97.1 F (36.2 C)  TempSrc: Oral     SpO2: 100% 97% 100% 100%  Weight:   43.7 kg   Height:       Constitutional: Somnolent, in no acute distress; denies chest pain or shortness of breath.  Patient is significantly frail and cachectic on exam. 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 ENMT: Mucous membranes are dry and examined. Posterior pharynx clear of any exudate or lesions. Neck: normal, supple, no masses, no thyromegaly, no JVD Respiratory: clear to auscultation bilaterally, no wheezing, no crackles. Normal respiratory effort. No accessory muscle use.  Cardiovascular: Regular rate and rhythm, no murmurs / rubs / gallops.  Trace edema on his lower extremities bilaterally. No carotid bruits.  Abdomen: no tenderness, no masses palpated. No hepatosplenomegaly. Bowel sounds positive.  Musculoskeletal: no clubbing / cyanosis. No joint deformity upper and lower extremities. Good ROM, no contractures. Normal muscle tone.  Skin: no rashes, lesions, ulcers. No induration Neurologic: Moving 4 limbs  spontaneously; muscle strength 3-/5 bilaterally symmetrically in the setting of poor effort. Psychiatric: Mood appears to be stable.   Labs on Admission: I have personally reviewed the following labs and imaging studies  CBC: Recent Labs  Lab 10/07/18 0940 10/13/18 0925  WBC 9.4 2.5*  NEUTROABS 7.7 2.1  HGB 10.4* 10.2*  HCT 31.0* 30.0*  MCV 94.5 91.2  PLT 223 353   Basic Metabolic Panel: Recent Labs  Lab 10/07/18 0940 10/13/18 0925  NA 134* 127*  K 3.5 2.3*  CL 93* 78*  CO2 32 38*  GLUCOSE 93 92  BUN 12 22  CREATININE 0.79 1.25*  CALCIUM 9.0 11.2*  MG  --  1.9   GFR: Estimated Creatinine Clearance: 35 mL/min (A) (by C-G formula based on SCr of 1.25 mg/dL (H)).   Liver Function Tests: Recent Labs  Lab 10/07/18 0940  AST 27  ALT 23  ALKPHOS 102  BILITOT 0.4  PROT 6.7  ALBUMIN 2.9*   CBG: Recent Labs  Lab 10/13/18 0859  GLUCAP 102*   Urine analysis:    Component Value Date/Time   COLORURINE STRAW (A) 10/13/2018 0925   APPEARANCEUR CLEAR 10/13/2018 0925   LABSPEC 1.005 10/13/2018 0925   PHURINE 7.0 10/13/2018 0925   GLUCOSEU NEGATIVE 10/13/2018 0925   HGBUR SMALL (A) 10/13/2018 0925   BILIRUBINUR NEGATIVE 10/13/2018 0925   KETONESUR NEGATIVE 10/13/2018 0925   PROTEINUR NEGATIVE 10/13/2018 0925   NITRITE NEGATIVE 10/13/2018 0925   LEUKOCYTESUR NEGATIVE 10/13/2018 0925   Radiological Exams on Admission: No results found.  EKG: None  Assessment/Plan 1-severe hypokalemia: In the setting of dehydration, poor oral intake and GI losses (patient received treatment with laxative and enemas for severe constipation) -Potassium 2.3 with normal magnesium on admission -Will provide fluid resuscitation and electrolytes repletion -Follow electrolytes trend -Patient encouraged to maintain adequate hydration and oral intake. -Extensive discussion with patient and family members at bedside; they are okay to pursued DNR status but is still not clear about  further decision of invasive/heroic treatment for artificial nutrition. -Palliative care has been consulted.  2-severe protein calorie malnutrition -As mentioned above discussion about artificial nutrition through PEG tube was entertained with patient and family members at bedside.  They will think about it -Palliative care has been consulted for further assistance and help with advanced care directions.  3-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 lung [facility]): Due to non-small cell lung cancer; stage IV. -Was actively receiving chemotherapy -Last chemotherapy treatment was on November 25 -Overall with very poor prognosis -Palliative care has been consulted.  4-hyponatremia/hypercalcemia -In the setting of severe dehydration as mentioned above -Providing IV fluids -Follow electrolytes trend.  5-acute kidney injury -Creatinine on admission 1.25 -Baseline 0.7-0.8 -Most likely prerenal in nature due to dehydration and continue use of nephrotoxic agents -Will discontinue NSAIDs, hold diuretics and ACE inhibitors -Provide fluid resuscitation -No signs of infection in his UA -Follow renal function trend.  6-essential hypertension -Patient blood pressure is now low -Hold antihypertensive agents -Provide fluid resuscitation -Given the fact patient was receiving Decadron will check cortisol level and start empirically Solu-Cortef.  7-chronic pain -Most likely associated with ongoing metastatic lesions -Continue as needed pain medication (dose has been adjusted given soft blood pressure) -Patient started on daily linzess to prevent constipation.  8-somnolence/lethargy -Most likely associated with recent adjustment to his pain medication in the setting of acute kidney injury from dehydration. -As mentioned above pain medication has been adjusted -Will follow clinical response -Keep patient comfortable.  DVT prophylaxis: Heparin Code Status: DNR Family Communication: Sister and  brother-in-law at bedside. Disposition Plan: To be determined.  Patient with 30 guarded prognosis.  Will follow clinical response and especially ability to maintain adequate oral intake and hydration. Consults called: Palliative care. Admission status: Inpatient, length of stay more than 2 midnights; telemetry bed.   Time Spent: 70 minutes  Barton Dubois MD [facility] Pager [phone]  If 7PM-7AM, please contact night-coverage [email or web address] Password [facility]  10/13/2018, 4:49 PM

## 2018-10-14 ENCOUNTER — Ambulatory Visit: Payer: Medicare HMO | Admitting: Nurse Practitioner

## 2018-10-14 LAB — PHOSPHORUS: Phosphorus: 2.9 mg/dL (ref 2.5–4.6)

## 2018-10-14 LAB — BASIC METABOLIC PANEL
Anion gap: 9 (ref 5–15)
BUN: 16 mg/dL (ref 8–23)
CO2: 33 mmol/L — AB (ref 22–32)
Calcium: 9 mg/dL (ref 8.9–10.3)
Chloride: 91 mmol/L — ABNORMAL LOW (ref 98–111)
Creatinine, Ser: 0.78 mg/dL (ref 0.61–1.24)
GFR calc Af Amer: >60 mL/min (ref >60)
GFR calc non Af Amer: >60 mL/min (ref >60)
Glucose, Bld: 98 mg/dL (ref 70–99)
Potassium: 2.7 mmol/L — CL (ref 3.5–5.1)
Sodium: 133 mmol/L — ABNORMAL LOW (ref 135–145)

## 2018-10-14 MED ORDER — POTASSIUM CHLORIDE 10 MEQ/100ML IV SOLN
10.0000 meq | INTRAVENOUS | Status: DC
  Administered 2018-10-14: 10 meq via INTRAVENOUS
  Filled 2018-10-14: qty 100

## 2018-10-14 MED ORDER — MORPHINE SULFATE ER 30 MG PO TBCR
30.0000 mg | EXTENDED_RELEASE_TABLET | Freq: Two times a day (BID) | ORAL | Status: DC
  Administered 2018-10-14 – 2018-10-17 (×6): 30 mg via ORAL
  Filled 2018-10-14 (×6): qty 1

## 2018-10-14 MED ORDER — POTASSIUM CHLORIDE CRYS ER 20 MEQ PO TBCR
40.0000 meq | EXTENDED_RELEASE_TABLET | ORAL | Status: AC
  Administered 2018-10-14 (×2): 40 meq via ORAL
  Filled 2018-10-14 (×3): qty 2

## 2018-10-14 MED ORDER — LOPERAMIDE HCL 2 MG PO CAPS
2.0000 mg | ORAL_CAPSULE | Freq: Once | ORAL | Status: AC
  Administered 2018-10-14: 2 mg via ORAL
  Filled 2018-10-14: qty 1

## 2018-10-14 NOTE — Plan of Care (Signed)
  Problem: Acute Rehab PT Goals(only PT should resolve) Goal: Pt Will Go Supine/Side To Sit Outcome: Progressing Flowsheets (Taken 10/14/2018 1229) Pt will go Supine/Side to Sit: with minimal assist Goal: Patient Will Transfer Sit To/From Stand Outcome: Progressing Flowsheets (Taken 10/14/2018 1229) Patient will transfer sit to/from stand: with min guard assist Goal: Pt Will Transfer Bed To Chair/Chair To Bed Outcome: Progressing Flowsheets (Taken 10/14/2018 1229) Pt will Transfer Bed to Chair/Chair to Bed: min guard assist Goal: Pt Will Ambulate Outcome: Progressing Flowsheets (Taken 10/14/2018 1229) Pt will Ambulate: 50 feet; with minimal assist; with rolling walker   12:29 PM, 10/14/18 Lonell Grandchild, MPT Physical Therapist with [facility] 336 [phone] office [phone] mobile phone

## 2018-10-14 NOTE — Progress Notes (Signed)
Palliative Medicine RN Note: Consult order noted. PMT provider will return to [facility] tomorrow. Mr Akhtar will be evaluated then.  Marjie Skiff Latrenda Irani, RN, BSN, [facility] Palliative Medicine Team 10/14/2018 8:43 AM Office [phone]

## 2018-10-14 NOTE — Clinical Social Work Note (Signed)
LCSW is aware of patient's SNF recommendation from PT. LCSW will await palliative care discussion with patient and family to discuss goals of care at this time.    Tashawn Laswell, Clydene Pugh, LCSW

## 2018-10-14 NOTE — Progress Notes (Signed)
CRITICAL VALUE ALERT  Critical Value:  k 2.7  Date & Time Notied:  6:25 AM  10/14/18    Provider Notified: opyd, md  Orders Received/Actions taken: awaiting orders

## 2018-10-14 NOTE — Progress Notes (Deleted)
Referring Provider: Joyice Faster, FNP Primary Care Physician:  Joyice Faster, FNP Primary GI:  Dr. Oneida Alar  No chief complaint on file.   HPI:   Gregory Hickman is a 67 y.o. male who presents for follow-up on dark stools and anemia.  Patient was recently seen in our office 07/09/2018 for the same as well as weight loss.  Noted history of GERD and daily alcohol consumption of 3-4 beers a day depending on finances.  No interest in quitting drinking.  Occasional marijuana and "cocaine if I can afford it."  Previous colonoscopy 2011 which is essentially normal other than tortuous colon and recommended repeat in 2021.  EGD the same day with diffuse erythema and edema of the gastric mucosa and frequent erosion status post biopsy, diffuse erythema and patchy edema of the duodenal bulb and second portion of the duodenum which is all consistent with moderate gastritis and duodenitis with biopsy showing H. pylori.  Unknown treatment status of H. Pylori.  If previous recommendation of labs and iFOBT with labs on 04/01/2018 showing normal hemoglobin, normal iron and ferritin.  It appears iFOBT was never completed.  At his last visit he was doing well, still with dark stools but on iron.  Noted unintentional weight loss of 20 pounds over the previous 3 months subjectively, objectively he has lost 8 pounds in 3 months.  His big concern was back pain and there is been ordered CT for the day.  He declined fecal occult blood testing.  No other GI symptoms.  Recommended follow-up labs, continue medications, follow-up in 3 months.  CBC completed 07/30/2018 showed persistent mild leukocytosis with white blood cell count of 11.3, low-normal hemoglobin at 13.1 with normal indices.  Past Medical History:  Diagnosis Date  . BPH (benign prostatic hyperplasia)   . Fracture   . Hypertension   . Metastasis ([place]) 09/02/2018    Past Surgical History:  Procedure Laterality Date  . APPENDECTOMY    . EYE  SURGERY  1975  . HAND SURGERY    . HERNIA REPAIR  2015  . PORTACATH PLACEMENT Right 09/13/2018   Procedure: INSERTION PORT-A-CATH;  Surgeon: Aviva Signs, MD;  Location: [facility];  Service: General;  Laterality: Right;    No current facility-administered medications for this visit.    No current outpatient medications on file.   Facility-Administered Medications Ordered in Other Visits  Medication Dose Route Frequency Provider Last Rate Last Dose  . 0.9 % NaCl with KCl 40 mEq / L  infusion   Intravenous Continuous Barton Dubois, MD 75 mL/hr at 10/14/18 0411 75 mL/hr at 10/14/18 0411  . acetaminophen (TYLENOL) tablet 650 mg  650 mg Oral Q6H PRN Barton Dubois, MD       Or  . acetaminophen (TYLENOL) suppository 650 mg  650 mg Rectal Q6H PRN Barton Dubois, MD      . dutasteride (AVODART) capsule 0.5 mg  0.5 mg Oral Daily Barton Dubois, MD      . feeding supplement (ENSURE ENLIVE) (ENSURE ENLIVE) liquid 237 mL  237 mL Oral TID BM Barton Dubois, MD   237 mL at 10/13/18 2027  . feeding supplement (PRO-STAT SUGAR FREE 64) liquid 30 mL  30 mL Oral BID Barton Dubois, MD   30 mL at 10/13/18 2215  . folic acid (FOLVITE) tablet 1 mg  1 mg Oral Daily Barton Dubois, MD      . heparin injection 5,000 Units  5,000 Units Subcutaneous Q8H Barton Dubois, MD  5,000 Units at 10/14/18 0644  . hydrocortisone sodium succinate (SOLU-CORTEF) 100 MG injection 50 mg  50 mg Intravenous Q8H Barton Dubois, MD   50 mg at 10/14/18 0003  . linaclotide (LINZESS) capsule 145 mcg  145 mcg Oral QAC breakfast Barton Dubois, MD      . morphine (MS CONTIN) 12 hr tablet 15 mg  15 mg Oral Q12H Barton Dubois, MD   15 mg at 10/13/18 2214  . morphine 2 MG/ML injection 1 mg  1 mg Intravenous Q4H PRN Barton Dubois, MD   1 mg at 10/13/18 2026  . ondansetron (ZOFRAN) tablet 4 mg  4 mg Oral Q6H PRN Barton Dubois, MD       Or  . ondansetron [facility]) injection 4 mg  4 mg Intravenous Q6H PRN Barton Dubois, MD      . pantoprazole  (PROTONIX) EC tablet 40 mg  40 mg Oral Daily Barton Dubois, MD      . potassium chloride 10 mEq in 100 mL IVPB  10 mEq Intravenous Q1 Hr x 3 Opyd, Ilene Qua, MD 100 mL/hr at 10/14/18 0650 10 mEq at 10/14/18 0650  . sodium chloride flush (NS) 0.9 % injection 10 mL  10 mL Intracatheter PRN Higgs, Mathis Dad, MD        Allergies as of 10/14/2018  . (No Known Allergies)    Family History  Problem Relation Age of Onset  . Colon cancer Neg Hx     Social History   Socioeconomic History  . Marital status: Divorced    Spouse name: Not on file  . Number of children: Not on file  . Years of education: Not on file  . Highest education level: Not on file  Occupational History  . Not on file  Social Needs  . Financial resource strain: Not on file  . Food insecurity:    Worry: Not on file    Inability: Not on file  . Transportation needs:    Medical: No    Non-medical: No  Tobacco Use  . Smoking status: Current Every Day Smoker    Packs/day: 0.50    Types: Cigarettes  . Smokeless tobacco: Former Systems developer    Types: Chew  . Tobacco comment: one pack of cigs last 3 days  Substance and Sexual Activity  . Alcohol use: Yes    Comment: As of 07/30/18 (1) 40 ounce beer daily  . Drug use: Yes    Types: Marijuana, "Crack" cocaine    Comment: 1-2 times per month; marijuana, cocaine when he can afford it  . Sexual activity: Not Currently  Lifestyle  . Physical activity:    Days per week: Not on file    Minutes per session: Not on file  . Stress: Not on file  Relationships  . Social connections:    Talks on phone: Not on file    Gets together: Not on file    Attends religious service: Not on file    Active member of club or organization: Not on file    Attends meetings of clubs or organizations: Not on file    Relationship status: Not on file  Other Topics Concern  . Not on file  Social History Narrative  . Not on file    Review of Systems: General: Negative for anorexia, weight loss,  fever, chills, fatigue, weakness. Eyes: Negative for vision changes.  ENT: Negative for hoarseness, difficulty swallowing , nasal congestion. CV: Negative for chest pain, angina, palpitations, dyspnea on exertion, peripheral edema.  Respiratory: Negative for dyspnea at rest, dyspnea on exertion, cough, sputum, wheezing.  GI: See history of present illness. GU:  Negative for dysuria, hematuria, urinary incontinence, urinary frequency, nocturnal urination.  MS: Negative for joint pain, low back pain.  Derm: Negative for rash or itching.  Neuro: Negative for weakness, abnormal sensation, seizure, frequent headaches, memory loss, confusion.  Psych: Negative for anxiety, depression, suicidal ideation, hallucinations.  Endo: Negative for unusual weight change.  Heme: Negative for bruising or bleeding. Allergy: Negative for rash or hives.   Physical Exam: There were no vitals taken for this visit. General:   Alert and oriented. Pleasant and cooperative. Well-nourished and well-developed.  Head:  Normocephalic and atraumatic. Eyes:  Without icterus, sclera clear and conjunctiva pink.  Ears:  Normal auditory acuity. Mouth:  No deformity or lesions, oral mucosa pink.  Throat/Neck:  Supple, without mass or thyromegaly. Cardiovascular:  S1, S2 present without murmurs appreciated. Normal pulses noted. Extremities without clubbing or edema. Respiratory:  Clear to auscultation bilaterally. No wheezes, rales, or rhonchi. No distress.  Gastrointestinal:  +BS, soft, non-tender and non-distended. No HSM noted. No guarding or rebound. No masses appreciated.  Rectal:  Deferred  Musculoskalatal:  Symmetrical without gross deformities. Normal posture. Skin:  Intact without significant lesions or rashes. Neurologic:  Alert and oriented x4;  grossly normal neurologically. Psych:  Alert and cooperative. Normal mood and affect. Heme/Lymph/Immune: No significant cervical adenopathy. No excessive bruising  noted.    10/14/2018 7:56 AM   Disclaimer: This note was dictated with voice recognition software. Similar sounding words can inadvertently be transcribed and may not be corrected upon review.

## 2018-10-14 NOTE — Progress Notes (Signed)
Initial Nutrition Assessment  DOCUMENTATION CODES:   Severe malnutrition in context of chronic illness, Underweight  INTERVENTION:  Ensure Enlive po BID, each supplement provides 350 kcal and 20 grams of protein   ProStat 30 ml BID (each 30 ml provides 100 kcal, 15 gr protein)   Full liquid diet -advance as tolerated per MD  NUTRITION DIAGNOSIS:   Severe Malnutrition related to chronic illness (Stage IV-non-small cell lung cancer) as evidenced by per patient/family report, percent weight loss, energy intake < or equal to 75% for > or equal to 1 month, severe muscle depletion, severe fat depletion. Patient also dehydrated on admission.   GOAL:   Patient will meet greater than or equal to 90% of their needs  MONITOR:   Diet advancement, Supplement acceptance, PO intake, Labs, Weight trends  REASON FOR ASSESSMENT:   Malnutrition Screening Tool, Low Braden Screen    ASSESSMENT: Patient is a 67 yo male with hx of Non-small cell lung cancer (stage IV), Metastasis, HTN who presents to ED with AMS, dehydration, poor oral intake and constipation. Patient found with -severe hypokalemia, hypercalcemia, AKI. Patient s/p portacath placement on 09/13/18. Smokes and has a hx of illegal drug use.  Patient appetite has been poor for several months per family at bedside. He usually only eats one meal daily-breakfast which is egg, toast, sausage and coffee. The remainder of the day is "nicknacks" oatmeal cookie, candy bar. He likes Ensure and drinks 1-2 daily. He also will consume V-8 juice. Able to feed himself but is slow with eating and observed to need encouragement, set-up assistance. He has 2 bottles of Ensure untouched on his table. Family member reported pt did take his protein supplement. RD re-inforced importance of supplement compliance given his poor oral intake - if patient desires to meet est needs.   Patient significantly underweight. Severe unplanned wt loss this month- 6% and 11% loss  since late August (just over 90 days time frame). Chronic suboptimal nutrition intake based on diet hx and depleted fat/muscle reserves.  Medications reviewed and include: Ensure Enlive, ProStat BID, Folivite, Linzess  IVF-NS with KCL @ 75 ml/hr  Labs: BMP Latest Ref Rng & Units 10/14/2018 10/13/2018 10/07/2018  Glucose 70 - 99 mg/dL 98 92 93  BUN 8 - 23 mg/dL 16 22 12   Creatinine 0.61 - 1.24 mg/dL 0.78 1.25(H) 0.79  Sodium 135 - 145 mmol/L 133(L) 127(L) 134(L)  Potassium 3.5 - 5.1 mmol/L 2.7(LL) 2.3(LL) 3.5  Chloride 98 - 111 mmol/L 91(L) 78(L) 93(L)  CO2 22 - 32 mmol/L 33(H) 38(H) 32  Calcium 8.9 - 10.3 mg/dL 9.0 11.2(H) 9.0     NUTRITION - FOCUSED PHYSICAL EXAM:    Most Recent Value  Orbital Region  Severe depletion  Upper Arm Region  Severe depletion  Thoracic and Lumbar Region  Severe depletion  Temple Region  Moderate depletion  Clavicle Bone Region  Severe depletion  Clavicle and Acromion Bone Region  Severe depletion  Dorsal Hand  Severe depletion  Edema (RD Assessment)  None  Mouth  Reviewed  Skin  Reviewed      Diet Order:   Diet Order            Diet full liquid Room service appropriate? Yes; Fluid consistency: Thin  Diet effective now             EDUCATION NEEDS:   Education needs have been addressed   Skin:  Skin Assessment: Reviewed RN Assessment  Last BM:  12/2- small  Height:  Ht Readings from Last 1 Encounters:  10/13/18 5\' 6"  (1.676 m)    Weight:   Wt Readings from Last 1 Encounters:  10/13/18 43.7 kg    Ideal Body Weight:  65 kg  BMI:  Body mass index is 15.55 kg/m.  Estimated Nutritional Needs:   Kcal:  1540-1760 (35-40 kcal/kg/bw) Pt to meet with palliative -est needs applicable if goal remain to meet needs.   Protein:  70-79 gr (1.6-1.8 gr/kg/bw)  Fluid:  >1100 ml daily  Colman Cater MS,RD,CSG,LDN Office: [phone] Pager: [phone]

## 2018-10-14 NOTE — Progress Notes (Signed)
PROGRESS NOTE    Gregory Hickman  DZH:[id number] DOB: 02/05/1951 DOA: 10/13/2018 PCP: Joyice Faster, FNP   Brief Narrative:  Mr. Gregory Hickman is a 67 y.o. male with a past medical history significant for BPH, non-small cell lung cancer (affecting left alone, stage IV), HTN, severe protein calorie malnutrition and recent visit to ED due to constipation from narcotics use; who presented on 10/13/18 to the hospital secondary to generalized weakness, altered mental status and poor oral intake.  Per family reports the last 3 days and not been eating or drinking anything and today was just too weak to even stand.  The change in his mentation is mainly increased somnolence and lethargy.  Patient has been experiencing intermittent episode of constant pain and back pain (which is not new and associated with his history of lung cancer).    Assessment & Plan:   Active Problems:   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 lung (HCC)   Hypokalemia   Severe protein-calorie malnutrition (HCC)   Dehydration   Hyponatremia   Hypercalcemia   AKI (acute kidney injury) ([place])  1-Severe hypokalemia: in the setting of dehydration, poor oral intake, and GI losses (patient received treatment with laxatives and enemas for severe constipation 3 days prior to admission) -K at 2.7 today -Will continue IV fluids and electrolyte repletion -Will continue to follow electrolytes trend -Patient encouraged to maintain adequate hydration and oral intake -Palliative care has been consulted and will see patient on 12/3  2-Severe protein calorie malnutrition -Sister states patient ate well today; still have not decided on PEG tube decision -Palliative care has been consulted for further assistance and help with advanced care directives.  3-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 lung ([place]): due to non-small cell lung cancer stage IV. -Patient was actively receiving chemotherapy -Last chemotherapy treatment was on  10/07/2018 -Overall with very poor prognosis -Palliative care has been consulted  4-Hyponatremia/Hypercalcemia -In the setting of severe dehydration; patient is slowly improving -Will continue IV fluids -Follow electrolytes trend  5-Acute kidney injury -Cr on admission was 1.25; currently at 0.78 which is his baseline -Most likely prerenal in nature due to dehydration and continued use of nephrotoxic agents -NSAIDs have been discontinued -Will continue to hold diuretics and ACE inhibitors -Will continue IV fluids -No signs of infection on his UA -Follow renal trend   6-Essential hypertension -Patients blood pressure is back to normal range -Will continue to hold antihypertensive agents -Will continue IV fluids -discontinue Solu-Cortef; Cortisol at 24.6  7-Chronic pain -Most likely associated with ongoing metastatic lesions -Will continue as needed pain medication -Will continue daily linzess for constipation  8-Somnolence/lethargy -Most likely associated with recent adjustment to his pain medication in the setting of acute kidney injury from dehydration.  -Pain medication has been adjusted given his blood pressure levels -Will follow clinical response -Keep patient comfortable -more alert and oriented today  DVT prophylaxis: Heparin Code Status: DNR Family Communication: Sister on bedside Disposition Plan: To be determined. Will follow clinical response and especially ability to maintain adequate oral intake and hydration  Consultants:   Palliative  Procedures:   None  Antimicrobials:  Anti-infectives (From admission, onward)   None      Subjective: Patient expressed pain in between his shoulder blades and in his legs. Per family he ate his best than what he has eaten in a long while. No fevers, no abdominal pain, no nausea, no vomiting.   Objective: Vitals:   10/13/18 1439 10/13/18 1510 10/13/18  2110 10/14/18 0514  BP: 118/67 113/70 (!) 83/54 111/65    Pulse: 74 75 65 64  Resp: 14 18 18 18   Temp:  (!) 97.1 F (36.2 C) 98.6 F (37 C) 98.5 F (36.9 C)  TempSrc:   Oral Oral  SpO2: 100% 100% 100% 100%  Weight: 43.7 kg     Height:        Intake/Output Summary (Last 24 hours) at 10/14/2018 1503 Last data filed at 10/14/2018 0900 Gross per 24 hour  Intake 1230 ml  Output 651 ml  Net 579 ml   Filed Weights   10/13/18 0903 10/13/18 1439  Weight: 43.1 kg 43.7 kg    Examination: General exam: Alert, awake, oriented x 3. Patient is somnolent, in no acute distress, denies chest pain and shortness of breath. He is significantly frail and cachectic on exam. Respiratory system: Clear to auscultation. Respiratory effort normal. Cardiovascular system:RRR. No murmurs, rubs, gallops. Trace edema on his lower extremities bilaterally. No carotid bruits.  Gastrointestinal system: Abdomen is nondistended, soft and nontender. No organomegaly or masses felt. Normal bowel sounds heard. Central nervous system: Alert and oriented. No focal neurological deficits. Muscle strength 3/5 bilaterally symmetrically in the setting of poor effort.  Extremities: No clubbing or cyanosis.  +pedal pulses Skin: No rashes, lesions or ulcers Psychiatry: Mood appears stable  Data Reviewed: I have personally reviewed following labs and imaging studies  CBC: Recent Labs  Lab 10/13/18 0925  WBC 2.5*  NEUTROABS 2.1  HGB 10.2*  HCT 30.0*  MCV 91.2  PLT 973   Basic Metabolic Panel: Recent Labs  Lab 10/13/18 0925 10/14/18 0424  NA 127* 133*  K 2.3* 2.7*  CL 78* 91*  CO2 38* 33*  GLUCOSE 92 98  BUN 22 16  CREATININE 1.25* 0.78  CALCIUM 11.2* 9.0  MG 1.9  --   PHOS  --  2.9   GFR: Estimated Creatinine Clearance: 54.6 mL/min (by C-G formula based on SCr of 0.78 mg/dL).  CBG: Recent Labs  Lab 10/13/18 0859  GLUCAP 102*   Urine analysis:    Component Value Date/Time   COLORURINE STRAW (A) 10/13/2018 0925   APPEARANCEUR CLEAR 10/13/2018 0925    LABSPEC 1.005 10/13/2018 0925   PHURINE 7.0 10/13/2018 0925   GLUCOSEU NEGATIVE 10/13/2018 0925   HGBUR SMALL (A) 10/13/2018 0925   BILIRUBINUR NEGATIVE 10/13/2018 0925   [place] 10/13/2018 0925   PROTEINUR NEGATIVE 10/13/2018 0925   NITRITE NEGATIVE 10/13/2018 0925   LEUKOCYTESUR NEGATIVE 10/13/2018 0925    Radiology Studies: No results found.   Scheduled Meds: . dutasteride  0.5 mg Oral Daily  . feeding supplement (ENSURE ENLIVE)  237 mL Oral TID BM  . feeding supplement (PRO-STAT SUGAR FREE 64)  30 mL Oral BID  . folic acid  1 mg Oral Daily  . heparin injection (subcutaneous)  5,000 Units Subcutaneous Q8H  . hydrocortisone sod succinate (SOLU-CORTEF) inj  50 mg Intravenous Q8H  . linaclotide  145 mcg Oral QAC breakfast  . morphine  15 mg Oral Q12H  . pantoprazole  40 mg Oral Daily  . potassium chloride  40 mEq Oral Q4H   Continuous Infusions: . 0.9 % NaCl with KCl 40 mEq / L 75 mL/hr (10/14/18 0411)     LOS: 1 day    Time spent: 30 minutes.    Barton Dubois, MD [facility] Pager [phone]  If 7PM-7AM, please contact night-coverage [email or web address] Password [facility] 10/14/2018, 3:03 PM

## 2018-10-14 NOTE — Evaluation (Signed)
Physical Therapy Evaluation Patient Details Name: Gregory Hickman MRN: [id number] DOB: December 14, 1950 Today's Date: 10/14/2018   History of Present Illness   Gregory Hickman is a 67 y.o. male with a past medical history significant for BPH, non-small cell lung cancer (affecting left alone, stage IV), HTN, severe protein calorie malnutrition and recent visit to ED due to constipation from narcotics use; who presented today to the hospital secondary to generalized weakness, altered mental status and poor oral intake.  Per family reports the last 2 days and not been eating or drinking anything and today was just too weak to even stand.  The change in his mentation is mainly increased somnolence and lethargy.  Patient has been experiencing intermittent episode of constant pain and back pain (which is not new and associated with his history of lung cancer).  Patient denies any dysuria, nausea, vomiting, hematochezia, melena focal weakness, dysuria, hematuria, fever, chills or any other complaints.    Clinical Impression  Patient presents with kyphotic trunk and looks down mostly of time due to difficulty keep head raised, unsteady on feet and limited for taking steps with hand held assist, required use of RW for safety, but tends to veer to the left requiring constant verbal/tactile cueing to prevent patient from bumping into objects on the left and to step closer to RW.  Patient limited secondary to fatigue, poor standing balance and tolerated sitting up in chair after therapy with family members in room.  Patient will benefit from continued physical therapy in hospital and recommended venue below to increase strength, balance, endurance for safe ADLs and gait.    Follow Up Recommendations SNF    Equipment Recommendations  Rolling walker with 5" wheels    Recommendations for Other Services       Precautions / Restrictions Precautions Precautions: Fall Restrictions Weight Bearing Restrictions:  No      Mobility  Bed Mobility Overal bed mobility: Needs Assistance Bed Mobility: Supine to Sit     Supine to sit: Mod assist     General bed mobility comments: slow labored movement with c/o severe pain with pressure to legs/feet  Transfers Overall transfer level: Needs assistance Equipment used: Rolling walker (2 wheeled);None;1 person hand held assist Transfers: Sit to/from Stand Sit to Stand: Min assist;Mod assist Stand pivot transfers: Min assist;Mod assist       General transfer comment: required verbal/tactile cueing for proper use of RW with fair carryover  Ambulation/Gait Ambulation/Gait assistance: Mod assist Gait Distance (Feet): 35 Feet Assistive device: Rolling walker (2 wheeled) Gait Pattern/deviations: Decreased step length - right;Decreased step length - left;Decreased stride length Gait velocity: decreased   General Gait Details: slow unsteady cadence with looking down most of time, unsafe for hand held assist requiring use of RW, patient veers to the left and pushes RW to far in front, limited secondary to fatigue  Stairs            Wheelchair Mobility    Modified Rankin (Stroke Patients Only)       Balance Overall balance assessment: Needs assistance Sitting-balance support: Feet unsupported;No upper extremity supported Sitting balance-Leahy Scale: Fair     Standing balance support: No upper extremity supported;During functional activity Standing balance-Leahy Scale: Poor Standing balance comment: fair/poor using RW                             Pertinent Vitals/Pain Pain Assessment: Faces Faces Pain Scale: Hurts even more Pain  Location: pressure to BLE/feet Pain Descriptors / Indicators: Sore;Grimacing;Guarding Pain Intervention(s): Limited activity within patient's tolerance;Monitored during session    Home Living Family/patient expects to be discharged to:: Private residence Living Arrangements: Other  relatives(sister) Available Help at Discharge: Family Type of Home: House Home Access: Stairs to enter Entrance Stairs-Rails: Right Entrance Stairs-Number of Steps: 4 Home Layout: One level;Laundry or work area in [place]: Civil engineer, contracting      Prior Function Level of Independence: Needs Water engineer / Transfers Assistance Needed: household ambulator without AD  ADL's / Homemaking Assistance Needed: assisted by family        Hand Dominance        Extremity/Trunk Assessment   Upper Extremity Assessment Upper Extremity Assessment: Generalized weakness    Lower Extremity Assessment Lower Extremity Assessment: Generalized weakness    Cervical / Trunk Assessment Cervical / Trunk Assessment: Kyphotic  Communication   Communication: No difficulties  Cognition Arousal/Alertness: Awake/alert Behavior During Therapy: WFL for tasks assessed/performed Overall Cognitive Status: Within Functional Limits for tasks assessed                                        General Comments      Exercises     Assessment/Plan    PT Assessment Patient needs continued PT services  PT Problem List Decreased strength;Decreased activity tolerance;Decreased balance;Decreased mobility       PT Treatment Interventions Gait training;Stair training;Functional mobility training;Therapeutic activities;Patient/family education;Therapeutic exercise    PT Goals (Current goals can be found in the Care Plan section)  Acute Rehab PT Goals Patient Stated Goal: return home with family to assist PT Goal Formulation: With patient/family Time For Goal Achievement: 10/28/18 Potential to Achieve Goals: Good    Frequency Min 3X/week   Barriers to discharge        Co-evaluation               AM-PAC PT "6 Clicks" Mobility  Outcome Measure Help needed turning from your back to your side while in a flat bed without using bedrails?: Total Help needed moving from  lying on your back to sitting on the side of a flat bed without using bedrails?: Total Help needed moving to and from a bed to a chair (including a wheelchair)?: Total Help needed standing up from a chair using your arms (e.g., wheelchair or bedside chair)?: A Lot Help needed to walk in hospital room?: A Lot Help needed climbing 3-5 steps with a railing? : A Lot 6 Click Score: 9    End of Session   Activity Tolerance: Patient tolerated treatment well;Patient limited by fatigue Patient left: in chair;with call bell/phone within reach;with family/visitor present Nurse Communication: Mobility status PT Visit Diagnosis: Unsteadiness on feet (R26.81);Other abnormalities of gait and mobility (R26.89);Muscle weakness (generalized) (M62.81)    Time: 5361-4431 PT Time Calculation (min) (ACUTE ONLY): 26 min   Charges:   PT Evaluation $PT Eval Moderate Complexity: 1 Mod PT Treatments $Therapeutic Activity: 23-37 mins        12:28 PM, 10/14/18 Lonell Grandchild, MPT Physical Therapist with [facility] [phone] office [phone] mobile phone

## 2018-10-15 ENCOUNTER — Inpatient Hospital Stay (HOSPITAL_COMMUNITY): Payer: Medicare HMO

## 2018-10-15 DIAGNOSIS — G893 Neoplasm related pain (acute) (chronic): Secondary | ICD-10-CM

## 2018-10-15 DIAGNOSIS — E876 Hypokalemia: Principal | ICD-10-CM

## 2018-10-15 DIAGNOSIS — R569 Unspecified convulsions: Secondary | ICD-10-CM

## 2018-10-15 DIAGNOSIS — E860 Dehydration: Secondary | ICD-10-CM

## 2018-10-15 DIAGNOSIS — Z515 Encounter for palliative care: Secondary | ICD-10-CM

## 2018-10-15 DIAGNOSIS — Z7189 Other specified counseling: Secondary | ICD-10-CM

## 2018-10-15 DIAGNOSIS — C799 Secondary malignant neoplasm of unspecified site: Secondary | ICD-10-CM

## 2018-10-15 DIAGNOSIS — E43 Unspecified severe protein-calorie malnutrition: Secondary | ICD-10-CM

## 2018-10-15 LAB — COMPREHENSIVE METABOLIC PANEL
ALT: 17 U/L (ref 0–44)
AST: 41 U/L (ref 15–41)
Albumin: 2.4 g/dL — ABNORMAL LOW (ref 3.5–5.0)
Alkaline Phosphatase: 101 U/L (ref 38–126)
Anion gap: 10 (ref 5–15)
BUN: 14 mg/dL (ref 8–23)
CHLORIDE: 100 mmol/L (ref 98–111)
CO2: 20 mmol/L — ABNORMAL LOW (ref 22–32)
Calcium: 8.4 mg/dL — ABNORMAL LOW (ref 8.9–10.3)
Creatinine, Ser: 0.77 mg/dL (ref 0.61–1.24)
GFR calc Af Amer: >60 mL/min (ref >60)
GFR calc non Af Amer: >60 mL/min (ref >60)
Glucose, Bld: 128 mg/dL — ABNORMAL HIGH (ref 70–99)
Potassium: 4.7 mmol/L (ref 3.5–5.1)
Sodium: 130 mmol/L — ABNORMAL LOW (ref 135–145)
Total Bilirubin: 0.4 mg/dL (ref 0.3–1.2)
Total Protein: 5.9 g/dL — ABNORMAL LOW (ref 6.5–8.1)

## 2018-10-15 LAB — CBC WITH DIFFERENTIAL/PLATELET
ABS IMMATURE GRANULOCYTES: 0.03 10*3/uL (ref 0.00–0.07)
BASOS ABS: 0 10*3/uL (ref 0.0–0.1)
Basophils Relative: 0 %
Eosinophils Absolute: 0 10*3/uL (ref 0.0–0.5)
Eosinophils Relative: 0 %
HCT: 29.4 % — ABNORMAL LOW (ref 39.0–52.0)
Hemoglobin: 9.4 g/dL — ABNORMAL LOW (ref 13.0–17.0)
Immature Granulocytes: 1 %
Lymphocytes Relative: 12 %
Lymphs Abs: 0.3 10*3/uL — ABNORMAL LOW (ref 0.7–4.0)
MCH: 30.7 pg (ref 26.0–34.0)
MCHC: 32 g/dL (ref 30.0–36.0)
MCV: 96.1 fL (ref 80.0–100.0)
Monocytes Absolute: 0.3 10*3/uL (ref 0.1–1.0)
Monocytes Relative: 10 %
NEUTROS ABS: 2.3 10*3/uL (ref 1.7–7.7)
Neutrophils Relative %: 77 %
Platelets: 148 10*3/uL — ABNORMAL LOW (ref 150–400)
RBC: 3.06 MIL/uL — ABNORMAL LOW (ref 4.22–5.81)
RDW: 14.9 % (ref 11.5–15.5)
WBC: 2.9 10*3/uL — ABNORMAL LOW (ref 4.0–10.5)
nRBC: 0 % (ref 0.0–0.2)

## 2018-10-15 LAB — PROTIME-INR
INR: 0.96
Prothrombin Time: 12.7 seconds (ref 11.4–15.2)

## 2018-10-15 LAB — GLUCOSE, CAPILLARY: Glucose-Capillary: 126 mg/dL — ABNORMAL HIGH (ref 70–99)

## 2018-10-15 LAB — TROPONIN I: Troponin I: 0.05 ng/mL (ref <0.03)

## 2018-10-15 LAB — HIV ANTIBODY (ROUTINE TESTING W REFLEX): HIV Screen 4th Generation wRfx: NONREACTIVE

## 2018-10-15 LAB — MAGNESIUM: MAGNESIUM: 1.3 mg/dL — AB (ref 1.7–2.4)

## 2018-10-15 MED ORDER — LORAZEPAM 2 MG/ML IJ SOLN
1.0000 mg | Freq: Once | INTRAMUSCULAR | Status: AC
  Filled 2018-10-15: qty 1

## 2018-10-15 MED ORDER — LORAZEPAM 2 MG/ML IJ SOLN
1.0000 mg | Freq: Once | INTRAMUSCULAR | Status: AC
  Administered 2018-10-15: 1 mg via INTRAVENOUS

## 2018-10-15 MED ORDER — LORAZEPAM 2 MG/ML IJ SOLN: 1.0000 mg | Freq: Once | INTRAMUSCULAR | Status: DC

## 2018-10-15 MED ORDER — LEVETIRACETAM IN NACL 500 MG/100ML IV SOLN
500.0000 mg | Freq: Two times a day (BID) | INTRAVENOUS | Status: DC
  Administered 2018-10-15 – 2018-10-16 (×3): 500 mg via INTRAVENOUS
  Filled 2018-10-15 (×3): qty 100

## 2018-10-15 MED ORDER — DEXAMETHASONE SODIUM PHOSPHATE 4 MG/ML IJ SOLN
4.0000 mg | Freq: Four times a day (QID) | INTRAMUSCULAR | Status: DC
  Administered 2018-10-15 – 2018-10-16 (×6): 4 mg via INTRAVENOUS
  Filled 2018-10-15 (×7): qty 1

## 2018-10-15 MED ORDER — LORAZEPAM 2 MG/ML IJ SOLN: 1.0000 mg | INTRAMUSCULAR | Status: DC | PRN

## 2018-10-15 MED ORDER — MAGNESIUM SULFATE 4 GM/100ML IV SOLN
4.0000 g | Freq: Once | INTRAVENOUS | Status: AC
  Administered 2018-10-15: 4 g via INTRAVENOUS
  Filled 2018-10-15: qty 100

## 2018-10-15 MED ORDER — LORAZEPAM 2 MG/ML IJ SOLN
INTRAMUSCULAR | Status: AC
  Administered 2018-10-15: 1 mg via INTRAVENOUS
  Filled 2018-10-15: qty 1

## 2018-10-15 MED ORDER — LORAZEPAM 2 MG/ML IJ SOLN
1.0000 mg | INTRAMUSCULAR | Status: DC | PRN
  Administered 2018-10-15: 1 mg via INTRAVENOUS

## 2018-10-15 NOTE — Consult Note (Addendum)
Consultation Note Date: 10/15/2018   Patient Name: Gregory Hickman  DOB: 12/06/1950  MRN: [id number]  Age / Sex: 67 y.o., male  PCP: Joyice Faster, FNP Referring Physician: Barton Dubois, MD  Reason for Consultation: Disposition and Establishing goals of care  HPI/Patient Profile: 67 y.o. male  with past medical history of HTN, BPH, and metastatic non-small cell lung cancer admitted on 10/13/2018 with weakness and lethargy. Patient recently diagnosed with stage IV non-small cell lung cancer with mets to multiple areas including brain, bone, and liver. Completed whole brain radiation. Currently receiving chemotherapy, last give on 11/25. In ED, patient with hypokalemia, AKI, dehydration, and hypercalcemia. Seizure activity early AM on 12/3. Palliative medicine consultation for goals of care.    Clinical Assessment and Goals of Care:  I have reviewed medical records, discussed with Dr. Dyann Kief, and met with patient's sisters Hilda Blades and Apple) and brother-in-law Ludwig Clarks) at bedside to discuss diagnosis prognosis, GOC, EOL wishes, disposition and options.  Patient will open eyes to voice but remains drowsy throughout my visit and does not participate in [place] conversation. He does wake to take sips of water. No s/s of pain or discomfort.   I introduced Palliative Medicine as specialized medical care for people living with serious illness. It focuses on providing relief from the symptoms and stress of a serious illness. The goal is to improve quality of life for both the patient and the family.  We discussed a brief life review of the patient. Not married and no children. Patient was active and independent until diagnosis of cancer this past September. Since that time, he has been living with sister and BIL. Functionally has become weaker. Lost >30 lbs and with poor nutritional status. He completed whole brain  radiation and received chemotherapy two times.   Explored family knowledge of oncology recommendations and thoughts on prognosis. Ludwig Clarks tells me the oncologist originally told [place] he had 6-12 months prognosis. Family understands cancer has spread to multiple areas throughout his body including brain, bone, and liver.   Discussed events leading up to hospitalization and course of hospital diagnoses and interventions. Discussed events overnight (seizure) and change in cognitive status possibly post-ictal vs medication vs cancer. Discussed declining functional, cognitive, and nutritional status contributing to overall prognosis and that he is currently too weak for further chemotherapy.   I attempted to elicit values and goals of care important to the patient and family. Advanced directives, concepts specific to code status, artifical feeding and hydration, and rehospitalization were considered and discussed. Hilda Blades shares that her brother has NOT completed living will/advanced directive paperwork. She shares that they are the only family members actively involved in his care. She shares that when Makarios was admitted, he mentioned he did not want to be "hooked up to machines" and decision for no resuscitation. Hilda Blades confirms this decision for DNR/DNI understanding that he is very frail and this will not change underlying cancer. We further discussed feeding tubes. Debra and Apple further discuss thoughts on feeding tube and both shake  their heads "no" with decision against feeding tube to prolong his life. Ludwig Clarks shares that [place] asked them yesterday why they continued to put him through pain.   The difference between aggressive medical intervention and comfort care was considered in light of the patient's goals of care. Discussed comfort feeds and EOL expectations. Discussed medications to ensure relief from pain and suffering.   Hospice services outpatient were explained and offered. Discussed home  with hospice versus inpatient hospice facility. Sister Hilda Blades considering options and wishes to further discuss with SW regarding insurance. Consult placed. Discussed watchful waiting overnight and plan to re-evaluate with them in the morning.   Questions and concerns were addressed.  Hard Choices and Gone from my [facility] booklets left for review. The family was encouraged to call with questions or concerns.  PMT will continue to support holistically.   SUMMARY OF RECOMMENDATIONS    GOC discussion with patient's two sisters (Debra and Apple) and BIL (Eddie). Understanding his progressive cancer and frailty, family confirms decision against heroic measures at EOL. Confirm DNR/DNI and NO feeding tube.   Continue comfort feeds per patient/family request.   Continue medications for symptom management.   Family understands that he is currently not appropriate for further chemotherapy with poor functional and nutritional status.   Discussed transition to comfort measures in order to allow comfort, peace, and dignity at EOL. Introduced hospice options. Sister considering home with hospice versus hospice facility. PMT provider to f/u with family tomorrow morning pending clinical course. Patient currently post-ictal s/p ativan dose.   Code Status/Advance Care Planning:  DNR  Symptom Management:   Continue MS Contin '30mg'$  BID  Continue Morphine '1mg'$  IV q4h prn pain/dyspnea  Continue Ativan '1mg'$  IV q4h prn seizure  Continue Linzess  Continue Decadron '4mg'$  IV q6h   Palliative Prophylaxis:   Aspiration, Bowel Regimen, Delirium Protocol, Frequent Pain Assessment, Oral Care and Turn Reposition  Additional Recommendations (Limitations, Scope, Preferences):  DNR/DNI, NO feeding tube  Psycho-social/Spiritual:   Desire for further Chaplaincy support:yes  Additional Recommendations: Caregiving  Support/Resources, Compassionate Wean Education and Education on Hospice  Prognosis:   Poor prognosis  with stage IV metastatic lung cancer and declining functional/nutritional status. Now s/p seizure and post-ictal  Discharge Planning: To Be Determined      Primary Diagnoses: Present on Admission: . Hypokalemia . Severe protein-calorie malnutrition ([place]) . 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 lung ([place]) . Dehydration . Hyponatremia . Hypercalcemia . AKI (acute kidney injury) ([place])   I have reviewed the medical record, interviewed the patient and family, and examined the patient. The following aspects are pertinent.  Past Medical History:  Diagnosis Date  . BPH (benign prostatic hyperplasia)   . Fracture   . Hypertension   . Metastasis ([place]) 09/02/2018   Social History   Socioeconomic History  . Marital status: Divorced    Spouse name: Not on file  . Number of children: Not on file  . Years of education: Not on file  . Highest education level: Not on file  Occupational History  . Not on file  Social Needs  . Financial resource strain: Not on file  . Food insecurity:    Worry: Not on file    Inability: Not on file  . Transportation needs:    Medical: No    Non-medical: No  Tobacco Use  . Smoking status: Current Every Day Smoker    Packs/day: 0.50    Types: Cigarettes  . Smokeless tobacco: Former Systems developer  Types: Chew  . Tobacco comment: one pack of cigs last 3 days  Substance and Sexual Activity  . Alcohol use: Yes    Comment: As of 07/30/18 (1) 40 ounce beer daily  . Drug use: Yes    Types: Marijuana, "Crack" cocaine    Comment: 1-2 times per month; marijuana, cocaine when he can afford it  . Sexual activity: Not Currently  Lifestyle  . Physical activity:    Days per week: Not on file    Minutes per session: Not on file  . Stress: Not on file  Relationships  . Social connections:    Talks on phone: Not on file    Gets together: Not on file    Attends religious service: Not on file    Active member of club or organization: Not on file    Attends  meetings of clubs or organizations: Not on file    Relationship status: Not on file  Other Topics Concern  . Not on file  Social History Narrative  . Not on file   Family History  Problem Relation Age of Onset  . Colon cancer Neg Hx    Scheduled Meds: . dexamethasone  4 mg Intravenous Q6H  . dutasteride  0.5 mg Oral Daily  . feeding supplement (ENSURE ENLIVE)  237 mL Oral TID BM  . feeding supplement (PRO-STAT SUGAR FREE 64)  30 mL Oral BID  . folic acid  1 mg Oral Daily  . heparin injection (subcutaneous)  5,000 Units Subcutaneous Q8H  . linaclotide  145 mcg Oral QAC breakfast  . morphine  30 mg Oral Q12H  . pantoprazole  40 mg Oral Daily   Continuous Infusions: . 0.9 % NaCl with KCl 40 mEq / L 75 mL/hr (10/15/18 0834)  . levETIRAcetam     PRN Meds:.acetaminophen **OR** acetaminophen, LORazepam, morphine injection, ondansetron **OR** ondansetron (ZOFRAN) IV Medications Prior to Admission:  Prior to Admission medications   Medication Sig Start Date End Date Taking? Authorizing Provider  amLODipine (NORVASC) 10 MG tablet Take 1 tablet by mouth daily.   Yes [provider]  atorvastatin (LIPITOR) 40 MG tablet Take 1 tablet by mouth at bedtime.    Yes [provider]  CARBOPLATIN IV Inject into the vein every 21 ( twenty-one) days.   Yes [provider]  dexamethasone (DECADRON) 4 MG tablet Take 1 tab two times a day the day before [place] chemo, day of chemotherapy and day after chemotherapy for a total of 3 days. 09/27/18  Yes Higgs, Mathis Dad, MD  dutasteride (AVODART) 0.5 MG capsule Take 1 capsule by mouth daily.   Yes [provider]  folic acid (FOLVITE) 1 MG tablet Take 1 tablet (1 mg total) by mouth daily. 08/30/18  Yes Higgs, Mathis Dad, MD  furosemide (LASIX) 20 MG tablet Take 2 tablets (40 mg total) by mouth daily. 10/08/18  Yes Higgs, Mathis Dad, MD  lidocaine-prilocaine (EMLA) cream Apply small amount to port site and cover with plastic wrap one  hour prior to appointment. 09/04/18  Yes Higgs, Mathis Dad, MD  lisinopril-hydrochlorothiazide (PRINZIDE,ZESTORETIC) 20-12.5 MG tablet Take 1 tablet by mouth 2 (two) times daily.    Yes [provider]  magic mouthwash SOLN Take 5 mLs by mouth 4 (four) times daily. Swish and spit 09/16/18  Yes Higgs, Mathis Dad, MD  morphine (MS CONTIN) 30 MG 12 hr tablet Take 1 tablet (30 mg total) by mouth every 12 (twelve) hours. 09/16/18  Yes Higgs, Mathis Dad, MD  morphine (MSIR) 30 MG  tablet TAKE  (1)  TABLET  EVERY FOUR HOURS AS NEEDED FOR SEVERE PAIN.    - MAY MAKE DROWSY - 10/07/18  Yes Higgs, Mathis Dad, MD  nabumetone (RELAFEN) 750 MG tablet Take 1 tablet by mouth 2 (two) times daily.   Yes [provider]  omeprazole (PRILOSEC) 20 MG capsule Take 1 capsule by mouth daily.   Yes [provider]  Pembrolizumab (KEYTRUDA IV) Inject into the vein every 21 ( twenty-one) days.   Yes [provider]  PEMEtrexed Disodium (ALIMTA IV) Inject into the vein every 21 ( twenty-one) days.   Yes [provider]  prochlorperazine (COMPAZINE) 10 MG tablet Take 1 tablet (10 mg total) by mouth every 6 (six) hours as needed (Nausea or vomiting). 09/04/18  Yes Higgs, Mathis Dad, MD  BANOPHEN 12.5 MG/5ML liquid Take 12.5 mg by mouth.  09/16/18   [provider]   No Known Allergies Review of Systems  Unable to perform ROS: Acuity of condition    Physical Exam  Constitutional: He is easily aroused. He appears ill.  HENT:  Head: Normocephalic and atraumatic.  Pulmonary/Chest: No accessory muscle usage. No tachypnea. No respiratory distress.  Abdominal: Soft. There is no tenderness.  Neurological: He is easily aroused.  Drowsy, disoriented, ? Post-ictal  Skin: Skin is warm and dry.  Psychiatric: Cognition and memory are impaired. He is noncommunicative. He is inattentive.  Nursing note and vitals reviewed.   Vital Signs: BP 132/82 (BP Location: Left Arm)   Pulse 69   Temp (!) 96.3 F (35.7  C) (Axillary)   Resp 20   Ht '5\' 6"'$  (1.676 m)   Wt 43.7 kg   SpO2 100%   BMI 15.55 kg/m  Pain Scale: PAINAD   Pain Score: 0-No pain   SpO2: SpO2: 100 % O2 Device:SpO2: 100 % O2 Flow Rate: .O2 Flow Rate (L/min): 2 L/min  IO: Intake/output summary:   Intake/Output Summary (Last 24 hours) at 10/15/2018 1519 Last data filed at 10/15/2018 1300 Gross per 24 hour  Intake 257.3 ml  Output 1400 ml  Net -1142.7 ml    LBM: Last BM Date: 10/14/18 Baseline Weight: Weight: 43.1 kg Most recent weight: Weight: 43.7 kg     Palliative Assessment/Data: PPS 30%   Flowsheet Rows     Most Recent Value  Intake Tab  Referral Department  Hospitalist  Unit at Time of Referral  Med/Surg Unit  Palliative Care Primary Diagnosis  Cancer  Palliative Care Type  New Palliative care  Date first seen by Palliative Care  10/15/18  Clinical Assessment  Palliative Performance Scale Score  30%  Psychosocial & Spiritual Assessment  Palliative Care Outcomes  Patient/Family meeting held?  Yes  Who was at the meeting?  patient, sisters, brother-in-law  Palliative Care Outcomes  Clarified goals of care, Provided end of life care assistance, Provided psychosocial or spiritual support, ACP counseling assistance, Improved pain interventions, Improved non-pain symptom therapy, Counseled regarding hospice      Time In: 1310 Time Out: 1420 Time Total: 31mn Greater than 50%  of this time was spent counseling and coordinating care related to the above assessment and plan.  Signed by:  MIhor Dow FNP-C Palliative Medicine Team  Phone: [phone]: [phone]  Please contact Palliative Medicine Team phone at [phone] questions and concerns.  For individual provider: See AShea Evans

## 2018-10-15 NOTE — Progress Notes (Signed)
Patients family member stepped out into hall to inform staff patient was having seizure  like activity. Patient has no history of seizures . I went into patients room to assess patient. Patient was convulsing, drooling and eye rolling  to side. Heather T RN called rapid response. Doctor Olevia Bowens came to patients room. Dr Olevia Bowens gave orders for 1mg  of Ativan IV, Magnesium level, Portable chest x ray,CT  scan of brain, PT-INR , Troponin level, CMP, and CBC. Patient vital signs was B/P 173/156, Pulse 107, O2 99% 2L nasal canula, blood sugar reading was 126. Patient received another 1mg  dose of ativan prior to CT scan. Patient now resting comfortably. Will continue to monitor throughout shift.

## 2018-10-15 NOTE — Progress Notes (Signed)
Night shift telemetry coverage note.  The patient was seen after a rapid response alert was called due to new onset seizures.  He has a history of BPH, hypertension, stage IV non-small cell lung carcinoma with multiple brain mets and severe protein calorie malnutrition and was admitted on 10/14/2018 due to severe hypokalemia.  He was given Ativan 1 mg IVP x2, labs were ordered and CT head without contrast as well.He is currently in postictal state and unable to provide any history.  Most recent vital signs before the incident at 2107 were temperature 98 F, pulse 60, respiration 18, blood pressure 112/62 mmHg and O2 sat 100% on room air.   General: Cachetic, postictal. Heent: Normocephalic, dentition in poor state of repair, mucosa mildly dry. Neck: Supple, no JVD. Lungs: Good air movement with subtle rhonchi. CV: Tachycardic in the low 100s, no lower extremity edema. Abdomen: Soft, nontender. Extremities: No edema, no cyanosis. Neuro: Postictal.  Seems to be grossly nonfocal.  His recent lab work White counts 2.9, hemoglobin 9.4 g/dL and platelets 148.  CMP shows a sodium of 130, CO2 of 20 mmol/L.  All other electrolytes are within normal limits when calcium is corrected to albumin.  Renal function was normal.  Glucose 128, total protein 5.9 and albumin 2.4 g/dL.  The rest of the hepatic function tests are within normal limits.  Troponin was 0.05 ng/mL.  Magnesium was 1.3 mg/dL.  That did not show any acute cardiopulmonary pathology.  CT brain shows unchanged brain mets with surrounding edema.  Assessment:  New onset seizure secondary to multiple brain metastases/non-small cell lung CA. Ativan 1 mg IVP every 4 hours as needed for seizures. Dexamethasone 4 mg IVP every 6 hours. Discussed briefly with his brother-in-law who which was present in his room when he started having seizure activity.  I notified him that given the advanced disease and not significant response to treatment, palliative care  should be considered.  Standing.  Hypomagnesemia Replacing aggressively.  Hypokalemia Continue replacement.  Tennis Must, MD  At about 45 minutes of critical care time were spent during this emergent event.  This document was prepared using Dragon voice recognition software and may contain some unintended transcription errors.

## 2018-10-15 NOTE — Progress Notes (Signed)
PROGRESS NOTE    Gregory Hickman  ZOX:[id number] DOB: [birth year] DOA: 10/13/2018 PCP: Joyice Faster, FNP   Brief Narrative:  Mr. Vangieson is a 67 y.o. male with a past medical history significant for BPH, non-small cell lung cancer (affecting left alone, stage IV), HTN, severe protein calorie malnutrition and recent visit to ED due to constipation from narcotics use; who presented on 10/13/18 to the hospital secondary to generalized weakness, altered mental status and poor oral intake.  Per family reports the last 3 days and not been eating or drinking anything and today was just too weak to even stand.  The change in his mentation is mainly increased somnolence and lethargy.  Patient has been experiencing intermittent episode of constant pain and back pain (which is not new and associated with his history of lung cancer).    Assessment & Plan:   Active Problems:   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 lung (HCC)   Hypokalemia   Severe protein-calorie malnutrition (HCC)   Dehydration   Hyponatremia   Hypercalcemia   AKI (acute kidney injury) ([place])  1-Severe hypokalemia: in the setting of dehydration, poor oral intake, and GI losses (patient received treatment with laxatives and enemas for severe constipation 3 days prior to admission) -K at 4.7 today -Mg at 1.3 and received IV repletion. -Will continue IV fluids and electrolyte repletion as needed -Will continue to follow electrolytes trend -Palliative care has seen the patient and after discussing with family the decision is for DNR/DNI and no feeding tube. Based on further clinical response plan is for home hospice vs residential hospice -Overall prognosis is very poor.  2-Severe protein calorie malnutrition -Patient is status post total -No eating or drinking noted -After discussion with palliative care decision has been made for no feeding tube or artificial nutrition. -Continue supportive care.  3-Malignant neoplasm  of upper lobe of left lung ([place]): due to non-small cell lung cancer stage IV. -Patient was actively receiving chemotherapy -Last chemotherapy treatment was on 10/07/2018 -Overall with very poor prognosis -Appreciate palliative care consultation will follow recommendations. -For now patient is DNR/DNI, no feeding tube  4-Hyponatremia/Hypercalcemia: Calcium was 11.2 on admission -In the setting of severe dehydration -After fluid resuscitation given calcium level 8.4 -Continue IV fluids -Follow electrolytes trend  5-Acute kidney injury -Cr on admission was 1.25; currently at 0.77 which is his baseline -Most likely prerenal in nature due to dehydration and continued use of nephrotoxic agents -NSAIDs have been discontinued -Will continue to hold diuretics and ACE inhibitors -Will continue IV fluids for now -No signs of infection on UA  6-Essential hypertension -Patients blood pressure is back to normal range -Will continue to hold antihypertensive agents -Will continue IV fluids -Solu-Cortef discontinued, cortisol at 24.6  7-Chronic pain -Most likely associated with ongoing metastatic lesions -Will continue as needed pain medication -Will continue daily linzess for constipation  8-Somnolence/lethargy -Most likely associated with recent adjustment to his pain medication in the setting of acute kidney injury from dehydration.  -Pain medication has been adjusted given his blood pressure levels -Will follow clinical response -Keep patient comfortable  9-New onset seizure; possibly secondary to brain metastases/NSCLC -Patient started on IV Decadron  -Started on IV Keppra BID   DVT prophylaxis: Heparin Code Status: DNR Family Communication: Sister on bedside Disposition Plan: To be determined. Will follow clinical response and especially ability to maintain adequate oral intake and hydration.  Follow final recommendations by palliative care.  Consultants:   Palliative  care  Procedures:   See results for X ray below  Antimicrobials:  Anti-infectives (From admission, onward)   None      Subjective: Patient appears to be in a postictal state secondary to an overnight seizure.  No eating, not drinking or able to follow any commands currently.  Mainly responding to painful stimuli.  Objective: Vitals:   10/13/18 2110 10/14/18 0514 10/14/18 2107 10/15/18 0558  BP: (!) 83/54 111/65 112/62 134/78  Pulse: 65 64 60 73  Resp: 18 18 18 19   Temp: 98.6 F (37 C) 98.5 F (36.9 C) 98 F (36.7 C) (!) 96.3 F (35.7 C)  TempSrc: Oral Oral Oral Axillary  SpO2: 100% 100% 100% 100%  Weight:      Height:        Intake/Output Summary (Last 24 hours) at 10/15/2018 1414 Last data filed at 10/15/2018 0900 Gross per 24 hour  Intake 497.3 ml  Output 1200 ml  Net -702.7 ml   Filed Weights   10/13/18 0903 10/13/18 1439  Weight: 43.1 kg 43.7 kg   Examination: General exam: Afebrile. Patient appears to be in a postictal state. Responds only to painful stimuli.  Respiratory system: Clear to auscultation. Respiratory effort normal. Cardiovascular system:RRR. No murmurs, rubs, gallops. Trace edema on his lower extremities bilaterally. No carotid bruits. Gastrointestinal system: Abdomen is nondistended, soft and nontender. No organomegaly or masses felt. Normal bowel sounds heard. Central nervous system: Patient is in a postictal state after suffering an overnight seizure. Responds only to painful stimuli Extremities: No clubbing or cyanosis. +pedal pulses Skin: No rashes, lesions or ulcers Psychiatry: Patient in postictal state.   Data Reviewed: I have personally reviewed following labs and imaging studies  CBC: Recent Labs  Lab 10/13/18 0925 10/15/18 0106  WBC 2.5* 2.9*  NEUTROABS 2.1 2.3  HGB 10.2* 9.4*  HCT 30.0* 29.4*  MCV 91.2 96.1  PLT 229 712*   Basic Metabolic Panel: Recent Labs  Lab 10/13/18 0925 10/14/18 0424 10/15/18 0106  NA 127*  133* 130*  K 2.3* 2.7* 4.7  CL 78* 91* 100  CO2 38* 33* 20*  GLUCOSE 92 98 128*  BUN 22 16 14   CREATININE 1.25* 0.78 0.77  CALCIUM 11.2* 9.0 8.4*  MG 1.9  --  1.3*  PHOS  --  2.9  --    GFR: Estimated Creatinine Clearance: 54.6 mL/min (by C-G formula based on SCr of 0.77 mg/dL).  CBG: Recent Labs  Lab 10/13/18 0859 10/15/18 0049  GLUCAP 102* 126*   Urine analysis:    Component Value Date/Time   COLORURINE STRAW (A) 10/13/2018 0925   APPEARANCEUR CLEAR 10/13/2018 0925   LABSPEC 1.005 10/13/2018 0925   PHURINE 7.0 10/13/2018 0925   GLUCOSEU NEGATIVE 10/13/2018 0925   HGBUR SMALL (A) 10/13/2018 0925   BILIRUBINUR NEGATIVE 10/13/2018 0925   KETONESUR NEGATIVE 10/13/2018 0925   PROTEINUR NEGATIVE 10/13/2018 0925   NITRITE NEGATIVE 10/13/2018 0925   LEUKOCYTESUR NEGATIVE 10/13/2018 0925    Radiology Studies: Ct Head Wo Contrast  Result Date: 10/15/2018 CLINICAL DATA:  New onset seizure EXAM: CT HEAD WITHOUT CONTRAST TECHNIQUE: Contiguous axial images were obtained from the base of the skull through the vertex without intravenous contrast. COMPARISON:  MRI 08/12/2018 FINDINGS: Brain: Vasogenic edema noted within the right temporal lobe and parietal lobe, left occipital lobe in the area of previously seen metastases on MRI. 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 Chronic small vessel disease throughout the deep white matter. No hemorrhage or acute infarction. No  hydrocephalus. Vascular: No hyperdense vessel or unexpected calcification. Skull: No acute calvarial abnormality. Sinuses/Orbits: Visualized paranasal sinuses and mastoids clear. Orbital soft tissues unremarkable. Other: None IMPRESSION: Edema related to widespread metastases as seen on prior MRI, unchanged. 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 No acute infarct or hemorrhage. Atrophy, chronic small vessel disease. Electronically Signed   By: Rolm Baptise M.D.   On: 10/15/2018 02:17   Dg Chest Port 1  View  Result Date: 10/15/2018 CLINICAL DATA:  New onset seizures.  History of lung cancer. EXAM: PORTABLE CHEST 1 VIEW COMPARISON:  Chest radiograph October 03, 2018 FINDINGS: Cardiac silhouette is normal size. Diffuse interstitial prominence with multiple pulmonary nodules, unchanged. Stable LEFT perihilar mass. No pleural effusion. No pneumothorax. Single-lumen RIGHT chest Port-A-Cath with distal tip projecting mid superior vena cava. Soft tissue planes and included osseous structures are unchanged. Coarse vascular calcifications in the neck. IMPRESSION: 1. No acute cardiopulmonary process. 2. Redemonstration of LEFT perihilar mass with multiple pulmonary nodules/metastasis. Electronically Signed   By: Elon Alas M.D.   On: 10/15/2018 01:29   Scheduled Meds: . dexamethasone  4 mg Intravenous Q6H  . dutasteride  0.5 mg Oral Daily  . feeding supplement (ENSURE ENLIVE)  237 mL Oral TID BM  . feeding supplement (PRO-STAT SUGAR FREE 64)  30 mL Oral BID  . folic acid  1 mg Oral Daily  . heparin injection (subcutaneous)  5,000 Units Subcutaneous Q8H  . linaclotide  145 mcg Oral QAC breakfast  . morphine  30 mg Oral Q12H  . pantoprazole  40 mg Oral Daily   Continuous Infusions: . 0.9 % NaCl with KCl 40 mEq / L 75 mL/hr (10/15/18 0834)  . levETIRAcetam       LOS: 2 days    Time spent: 30 minutes.    Barton Dubois, MD [facility] Pager [phone]  If 7PM-7AM, please contact night-coverage [email or web address] Password [email or web address] 10/15/2018, 2:14 PM

## 2018-10-15 NOTE — Progress Notes (Signed)
CRITICAL VALUE ALERT  Critical Value:  Troponin 0.05  Date & Time Notied:  10/15/18,0153  Provider Notified: 10/15/18,0153  Orders Received/Actions taken: no new orders at this time

## 2018-10-15 NOTE — Progress Notes (Signed)
Consult received and chart reviewed. Sister, Hilda Blades will gather family to discuss [place] today with PMT provider. Awaiting return call with time when family is available.   NO CHARGE  Ihor Dow, FNP-C Palliative Medicine Team  Phone: [phone] Fax: [phone]

## 2018-10-15 NOTE — Progress Notes (Signed)
Reported that patient had seizure over night and was given ativan in result - patient remains drowsy this morning.  Will wake up briefly but quickly dozes back off.  Able to answer a few questions.  Attempted breakfast, patient drank some juice and coffee but also was dribbling liquid as he was trying to fall back asleep.  Was able to get few medications down - will attempt again when patient more alert

## 2018-10-16 DIAGNOSIS — N179 Acute kidney failure, unspecified: Secondary | ICD-10-CM

## 2018-10-16 DIAGNOSIS — R531 Weakness: Secondary | ICD-10-CM

## 2018-10-16 DIAGNOSIS — G893 Neoplasm related pain (acute) (chronic): Secondary | ICD-10-CM

## 2018-10-16 MED ORDER — LEVETIRACETAM 100 MG/ML PO SOLN
500.0000 mg | Freq: Two times a day (BID) | ORAL | Status: DC
  Administered 2018-10-16 – 2018-10-17 (×2): 500 mg via ORAL
  Filled 2018-10-16 (×4): qty 5

## 2018-10-16 MED ORDER — DEXAMETHASONE 4 MG PO TABS
4.0000 mg | ORAL_TABLET | Freq: Four times a day (QID) | ORAL | Status: DC
  Administered 2018-10-16 – 2018-10-17 (×5): 4 mg via ORAL
  Filled 2018-10-16 (×5): qty 1

## 2018-10-16 NOTE — Progress Notes (Signed)
Daily Progress Note   Patient Name: Gregory Hickman       Date: 10/16/2018 DOB: Apr 18, 1951  Age: 67 y.o. MRN#: [id number] Attending Physician: Rodena Goldmann, DO Primary Care Physician: Joyice Faster, FNP Admit Date: 10/13/2018  Reason for Consultation/Follow-up: Establishing goals of care  Subjective: Patient awake, alert, sitting comfortably in recliner. He is pleasantly confused. Oriented him to place and situation. He minimally participates in conversation. He denies current pain or discomfort. He tells me he ate breakfast.   GOC:  F/u with sister Gregory Hickman) and another sister at bedside. Again discussed course of hospitalization including diagnoses, interventions, and prognosis. Although patient is more awake and alert today, we did discuss big picture of him declining due to metastatic cancer. We discussed his frailty and weakness and concern that further chemo may take him faster. Sisters speak of wishing for "comfort" and enjoying each day to the fullest. Confirmed focus on comfort measures and quality days moving forward. Discussed role of symptom management to ensure relief from pain and suffering. Discussed comfort feeds. Discussed EOL expectations and symptoms.   Discussed in detail hospice options. Sisters agree that physical therapy should not be forced because physically he will not be able to tolerate. Initially sister was determined to take "Gregory Hickman" home with hospice services, understanding hospice philosophy. After further discussion, sister requests a facility for EOL care. She speaks of Gregory Hickman being stubborn with her and not understanding he needs assistance with care. She is unsure she will be able to care for him at home.   Further discussed with social work. Discharge plan  will be residential hospice facility.   Answered all questions and concerns for family. Discussed symptom management regimen. Sister has PMT contact information.   Length of Stay: 3  Current Medications: Scheduled Meds:  . dexamethasone  4 mg Intravenous Q6H  . dutasteride  0.5 mg Oral Daily  . feeding supplement (ENSURE ENLIVE)  237 mL Oral TID BM  . feeding supplement (PRO-STAT SUGAR FREE 64)  30 mL Oral BID  . folic acid  1 mg Oral Daily  . heparin injection (subcutaneous)  5,000 Units Subcutaneous Q8H  . linaclotide  145 mcg Oral QAC breakfast  . morphine  30 mg Oral Q12H  . pantoprazole  40 mg Oral Daily    Continuous Infusions: .  0.9 % NaCl with KCl 40 mEq / L 75 mL/hr at 10/16/18 0305  . levETIRAcetam 500 mg (10/16/18 0920)    PRN Meds: acetaminophen **OR** acetaminophen, LORazepam, morphine injection, ondansetron **OR** ondansetron (ZOFRAN) IV  Physical Exam  Constitutional: He is easily aroused. He appears ill.  HENT:  Head: Normocephalic and atraumatic.  Pulmonary/Chest: No accessory muscle usage. No tachypnea. No respiratory distress.  Abdominal: There is no tenderness.  Neurological: He is alert and easily aroused.  Reoriented to time/place.   Skin: Skin is warm and dry.  Psychiatric: His speech is delayed. He is inattentive.  Nursing note and vitals reviewed.          Vital Signs: BP 120/65 (BP Location: Left Arm)   Pulse 70   Temp 98 F (36.7 C) (Oral)   Resp 18   Ht 5\' 6"  (1.676 m)   Wt 43.7 kg   SpO2 100%   BMI 15.55 kg/m  SpO2: SpO2: 100 % O2 Device: O2 Device: Room Air O2 Flow Rate: O2 Flow Rate (L/min): 2 L/min  Intake/output summary:   Intake/Output Summary (Last 24 hours) at 10/16/2018 1031 Last data filed at 10/16/2018 0102 Gross per 24 hour  Intake 1907.12 ml  Output 2400 ml  Net -492.88 ml   LBM: Last BM Date: 10/16/18 Baseline Weight: Weight: 43.1 kg Most recent weight: Weight: 43.7 kg       Palliative Assessment/Data: PPS  40%   Flowsheet Rows     Most Recent Value  Intake Tab  Referral Department  Hospitalist  Unit at Time of Referral  Med/Surg Unit  Palliative Care Primary Diagnosis  Cancer  Palliative Care Type  New Palliative care  Date first seen by Palliative Care  10/15/18  Clinical Assessment  Palliative Performance Scale Score  40%  Psychosocial & Spiritual Assessment  Palliative Care Outcomes  Patient/Family meeting held?  Yes  Who was at the meeting?  patient and sister  Palliative Care Outcomes  Clarified goals of care, Counseled regarding hospice, Provided end of life care assistance, Provided advance care planning, Provided psychosocial or spiritual support, ACP counseling assistance      Patient Active Problem List   Diagnosis Date Noted  . Palliative care by specialist   . Goals of care, counseling/discussion   . Seizure ([place])   . Cancer related pain   . Hypokalemia 10/13/2018  . Severe protein-calorie malnutrition ([place]) 10/13/2018  . Dehydration 10/13/2018  . Hyponatremia 10/13/2018  . Hypercalcemia 10/13/2018  . AKI (acute kidney injury) ([place]) 10/13/2018  . 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 Metastatic cancer ([place]) 09/02/2018  . 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 lung ([place]) 08/28/2018  . Anemia 04/09/2018  . Dark stools 04/09/2018  . RECTAL BLEEDING 05/31/2010  . ANOREXIA 05/31/2010  . WEIGHT LOSS, RECENT 05/31/2010    Palliative Care Assessment & Plan   Patient Profile: 67 y.o. male  with past medical history of HTN, BPH, and metastatic non-small cell lung cancer admitted on 10/13/2018 with weakness and lethargy. Patient recently diagnosed with stage IV non-small cell lung cancer with mets to multiple areas including brain, bone, and liver. Completed whole brain radiation. Currently receiving chemotherapy, last give on 11/25. In ED, patient with hypokalemia, AKI, dehydration, and hypercalcemia. Seizure activity early AM on 12/3.  Palliative medicine consultation for goals of care.    Assessment: Metastatic non-small cell lung cancer Severe protein calorie malnutrition Severe hypokalemia Constipation Hyponatremia Hypercalcemia Acute kidney injury Chronic  cancer related pain New onset seizure  Recommendations/Plan:  DNR/DNI. NO feeding tube. NO further chemotherapy.   Focus on comfort and quality of life.   Discussed hospice options and philosophy in detail. Plan is for discharge to residential hospice [facility]). SW notified.   Symptom management  Continue MS Contin 30mg  BID  Continue prn IV morphine  Linzess daily   Continue Keppra and prn IV ativan for seizures  Recommend continuing decadron for comfort  Code Status: DNR   Code Status Orders  (From admission, onward)         Start     Ordered   10/13/18 1643  Do not attempt resuscitation (DNR)  Continuous    Question Answer Comment  In the event of cardiac or respiratory ARREST Do not call a "code blue"   In the event of cardiac or respiratory ARREST Do not perform Intubation, CPR, defibrillation or ACLS   In the event of cardiac or respiratory ARREST Use medication by any route, position, wound care, and other measures to relive pain and suffering. May use oxygen, suction and manual treatment of airway obstruction as needed for comfort.      10/13/18 1648        Code Status History    Date Active Date Inactive Code Status Order ID Comments User Context   10/13/2018 1629 10/13/2018 1648 DNR [id number]  Barton Dubois, MD Inpatient       Prognosis:   Poor prognosis likely weeks secondary to metastatic lung cancer and declining functional/nutritional status.   Discharge Planning:   Hospice facility  Care plan was discussed with patient, sister, family at bedside, Dr. Manuella Ghazi, [place], RN  Thank you for allowing the Palliative Medicine Team to assist in the care of this patient.   Time In: 1030 1330- Time Out: 1115 1345 Total  Time 60 Prolonged Time Billed  no      Greater than 50%  of this time was spent counseling and coordinating care related to the above assessment and plan.  Ihor Dow, FNP-C Palliative Medicine Team  Phone: [phone] Fax: [phone]  Please contact Palliative Medicine Team phone at [phone] for questions and concerns.

## 2018-10-16 NOTE — Clinical Social Work Note (Signed)
Per Jinny Blossom with Palliative Care, referral was made to [facility].   Arnell Slivinski, Clydene Pugh, LCSW

## 2018-10-16 NOTE — Progress Notes (Signed)
PROGRESS NOTE    Gregory Hickman  PPI:[id number] DOB: September 16, 1951 DOA: 10/13/2018 PCP: Joyice Faster, FNP   Brief Narrative:  Gregory Hickman a 67 y.o.malewith a past medical history significant for BPH, non-small cell lung cancer (affecting left alone, stage IV),HTN, severe protein calorie malnutrition and recent visit to ED due to constipation from narcotics use;who presented on 10/13/18 to the hospital secondary to generalized weakness, altered mental status and poor oral intake. Per family reports the last 3 days and not been eating or drinking anything and today was just too weak to even stand. The change in his mentation is mainly increased somnolence and lethargy. Patient has been experiencing intermittent episode of constant pain and back pain (which is not new and associated with his history of lung cancer).   Assessment & Plan:   Active Problems:   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 lung (HCC)   Metastatic cancer (HCC)   Hypokalemia   Severe protein-calorie malnutrition (HCC)   Dehydration   Hyponatremia   Hypercalcemia   AKI (acute kidney injury) ([place])   Palliative care by specialist   Goals of care, counseling/discussion   Seizure ([place])   Cancer related pain  1-Severe hypokalemia-resolved: in the setting of dehydration, poor oral intake, and GI losses (patient received treatment with laxatives and enemas for severe constipation 3 days prior to admission) -K at 4.7 today -DC further IV fluids and further lab work. -Palliative care has seen the patient and after discussing with family the decision is for DNR/DNI and no feeding tube. Based on further clinical response plan is for home hospice vs residential hospice -Overall prognosis is very poor.  2-Severe protein calorie malnutrition -After discussion with palliative care decision has been made for no feeding tube or artificial nutrition. -Continue supportive care with comfort feeds  3-Malignant  neoplasm of upper lobe of left lung ([place]): due to non-small cell lung cancer stage IV. -Patient was actively receiving chemotherapy -Last chemotherapy treatment was on 10/07/2018 -Overall with very poor prognosis -Appreciate palliative care consultation will follow recommendations. -For now patient is DNR/DNI, no feeding tube  4-Hyponatremia/Hypercalcemia-resolved: Calcium was 11.2 on admission -In the setting of severe dehydration -After fluid resuscitation given calcium level 8.4  5-Acute kidney injury -Cr on admission was 1.25; currently at 0.77 which is his baseline -Most likely prerenal in nature due to dehydration and continued use of nephrotoxic agents -NSAIDs have been discontinued -Will continue to hold diuretics and ACE inhibitors -DC IV fluid -No signs of infection on UA  6-Essential hypertension -Patients blood pressure is back to normal range -Will continue to hold antihypertensive agents -DC IV fluid -Solu-Cortef discontinued, cortisol at 24.6  7-Chronic pain -Most likely associated with ongoing metastatic lesions -Will continue as needed pain medication -Will continue daily linzess for constipation -Recommend home suppositories as needed  8-Somnolence/lethargy -Most likely associated with recent adjustment to his pain medication in the setting of acute kidney injury from dehydration.  -Pain medication has been adjusted given his blood pressure levels -Will follow clinical response -Keep patient comfortable  9-New onset seizure; possibly secondary to brain metastases/NSCLC -Patient now on oral Decadron and oral Keppra   DVT prophylaxis:Heparin Code Status: DNR Family Communication: Sisters and Brother at bedside Disposition Plan: Anticipate discharge to home hospice once everything has been set up at home.  Appreciate palliative care recommendations.   Consultants:   Palliative care  Procedures:   None  Antimicrobials:    None   Subjective: Patient seen and evaluated  today with no new acute complaints or concerns. No acute concerns or events noted overnight.  No further seizures noted overnight.  He is more awake and alert this morning.  He is actually having some breakfast.  Family members are at bedside.  They have elected home hospice, but this will take a while to set up before patient can be discharged.  Objective: Vitals:   10/15/18 0558 10/15/18 1443 10/15/18 2229 10/16/18 0700  BP: 134/78 132/82 120/73 120/65  Pulse: 73 69 76 70  Resp: 19 20 16 18   Temp: (!) 96.3 F (35.7 C)  97.9 F (36.6 C) 98 F (36.7 C)  TempSrc: Axillary  Oral Oral  SpO2: 100% 100% 100% 100%  Weight:      Height:        Intake/Output Summary (Last 24 hours) at 10/16/2018 1134 Last data filed at 10/16/2018 0953 Gross per 24 hour  Intake 1907.12 ml  Output 2400 ml  Net -492.88 ml   Filed Weights   10/13/18 0903 10/13/18 1439  Weight: 43.1 kg 43.7 kg    Examination:  General exam: Appears calm and comfortable  Respiratory system: Clear to auscultation. Respiratory effort normal. Cardiovascular system: S1 & S2 heard, RRR. No JVD, murmurs, rubs, gallops or clicks. No pedal edema. Gastrointestinal system: Abdomen is nondistended, soft and nontender. No organomegaly or masses felt. Normal bowel sounds heard. Central nervous system: Cannot be adequately evaluated. Extremities: Symmetric 5 x 5 power. Skin: No rashes, lesions or ulcers Psychiatry: Cannot be evaluated.    Data Reviewed: I have personally reviewed following labs and imaging studies  CBC: Recent Labs  Lab 10/13/18 0925 10/15/18 0106  WBC 2.5* 2.9*  NEUTROABS 2.1 2.3  HGB 10.2* 9.4*  HCT 30.0* 29.4*  MCV 91.2 96.1  PLT 229 536*   Basic Metabolic Panel: Recent Labs  Lab 10/13/18 0925 10/14/18 0424 10/15/18 0106  NA 127* 133* 130*  K 2.3* 2.7* 4.7  CL 78* 91* 100  CO2 38* 33* 20*  GLUCOSE 92 98 128*  BUN 22 16 14   CREATININE  1.25* 0.78 0.77  CALCIUM 11.2* 9.0 8.4*  MG 1.9  --  1.3*  PHOS  --  2.9  --    GFR: Estimated Creatinine Clearance: 54.6 mL/min (by C-G formula based on SCr of 0.77 mg/dL). Liver Function Tests: Recent Labs  Lab 10/15/18 0106  AST 41  ALT 17  ALKPHOS 101  BILITOT 0.4  PROT 5.9*  ALBUMIN 2.4*   No results for input(s): LIPASE, AMYLASE in the last 168 hours. No results for input(s): AMMONIA in the last 168 hours. Coagulation Profile: Recent Labs  Lab 10/15/18 0106  INR 0.96   Cardiac Enzymes: Recent Labs  Lab 10/15/18 0106  TROPONINI 0.05*   BNP (last 3 results) No results for input(s): PROBNP in the last 8760 hours. HbA1C: No results for input(s): HGBA1C in the last 72 hours. CBG: Recent Labs  Lab 10/13/18 0859 10/15/18 0049  GLUCAP 102* 126*   Lipid Profile: No results for input(s): CHOL, HDL, LDLCALC, TRIG, CHOLHDL, LDLDIRECT in the last 72 hours. Thyroid Function Tests: No results for input(s): TSH, T4TOTAL, FREET4, T3FREE, THYROIDAB in the last 72 hours. Anemia Panel: No results for input(s): VITAMINB12, FOLATE, FERRITIN, TIBC, IRON, RETICCTPCT in the last 72 hours. Sepsis Labs: Recent Labs  Lab 10/13/18 0943  LATICACIDVEN 1.28    No results found for this or any previous visit (from the past 240 hour(s)).       Radiology Studies:  Ct Head Wo Contrast  Result Date: 10/15/2018 CLINICAL DATA:  New onset seizure EXAM: CT HEAD WITHOUT CONTRAST TECHNIQUE: Contiguous axial images were obtained from the base of the skull through the vertex without intravenous contrast. COMPARISON:  MRI 08/12/2018 FINDINGS: Brain: Vasogenic edema noted within the right temporal lobe and parietal lobe, left occipital lobe in the area of previously seen metastases on MRI. 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 Chronic small vessel disease throughout the deep white matter. No hemorrhage or acute infarction. No hydrocephalus. Vascular: No hyperdense vessel or  unexpected calcification. Skull: No acute calvarial abnormality. Sinuses/Orbits: Visualized paranasal sinuses and mastoids clear. Orbital soft tissues unremarkable. Other: None IMPRESSION: Edema related to widespread metastases as seen on prior MRI, unchanged. 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 No acute infarct or hemorrhage. Atrophy, chronic small vessel disease. Electronically Signed   By: Rolm Baptise M.D.   On: 10/15/2018 02:17   Dg Chest Port 1 View  Result Date: 10/15/2018 CLINICAL DATA:  New onset seizures.  History of lung cancer. EXAM: PORTABLE CHEST 1 VIEW COMPARISON:  Chest radiograph October 03, 2018 FINDINGS: Cardiac silhouette is normal size. Diffuse interstitial prominence with multiple pulmonary nodules, unchanged. Stable LEFT perihilar mass. No pleural effusion. No pneumothorax. Single-lumen RIGHT chest Port-A-Cath with distal tip projecting mid superior vena cava. Soft tissue planes and included osseous structures are unchanged. Coarse vascular calcifications in the neck. IMPRESSION: 1. No acute cardiopulmonary process. 2. Redemonstration of LEFT perihilar mass with multiple pulmonary nodules/metastasis. Electronically Signed   By: Elon Alas M.D.   On: 10/15/2018 01:29        Scheduled Meds: . dexamethasone  4 mg Oral Q6H  . dutasteride  0.5 mg Oral Daily  . feeding supplement (ENSURE ENLIVE)  237 mL Oral TID BM  . feeding supplement (PRO-STAT SUGAR FREE 64)  30 mL Oral BID  . folic acid  1 mg Oral Daily  . heparin injection (subcutaneous)  5,000 Units Subcutaneous Q8H  . levETIRAcetam  500 mg Oral BID  . linaclotide  145 mcg Oral QAC breakfast  . morphine  30 mg Oral Q12H  . pantoprazole  40 mg Oral Daily   Continuous Infusions:   LOS: 3 days    Time spent: 30 minutes    Sherl Yzaguirre Darleen Crocker, DO [facility] Pager [phone]  If 7PM-7AM, please contact night-coverage [email or web address] Password [email or web address] 10/16/2018, 11:35 AM

## 2018-10-16 NOTE — Progress Notes (Signed)
Physical Therapy Treatment Patient Details Name: Gregory Hickman MRN: [id number] DOB: 1951-07-27 Today's Date: 10/16/2018    History of Present Illness  Gregory Hickman is a 67 y.o. male with a past medical history significant for BPH, non-small cell lung cancer (affecting left alone, stage IV), HTN, severe protein calorie malnutrition and recent visit to ED due to constipation from narcotics use; who presented today to the hospital secondary to generalized weakness, altered mental status and poor oral intake.  Per family reports the last 2 days and not been eating or drinking anything and today was just too weak to even stand.  The change in his mentation is mainly increased somnolence and lethargy.  Patient has been experiencing intermittent episode of constant pain and back pain (which is not new and associated with his history of lung cancer).  Patient denies any dysuria, nausea, vomiting, hematochezia, melena focal weakness, dysuria, hematuria, fever, chills or any other complaints.    PT Comments    Patient sitting in recliner when PT arrived. Patient agreeable to participate in PT. Performed there ex in chair. Transfer sit to stand with min to mod assistance and cues for sequencing of steps and placement of hands on RW. Patient stood with assistance, took a few steps with assistance to advance RW to make a left turn towards the door. Patient then became incontinent of bowel. Sister then returned to room and assisted PT in toileting and hygiene activites while patient stood in [place] with assistance for balance. Patient exhibited fair endurance for standing during hygiene activities. During ambulation, slow unsteady gait looking down most of the time. Patient veers to left and pushes RW too far in front. Limited secondary to incontinent of bowel.   Follow Up Recommendations  SNF     Equipment Recommendations  Rolling walker with 5" wheels    Recommendations for Other Services        Precautions / Restrictions Precautions Precautions: Fall Restrictions Weight Bearing Restrictions: No    Mobility  Bed Mobility               General bed mobility comments: patient in chair at beginning and end of session  Transfers Overall transfer level: Needs assistance Equipment used: Rolling walker (2 wheeled) Transfers: Sit to/from Stand Sit to Stand: Min assist;Mod assist         General transfer comment: required verbal/tactile cueing for proper use of RW with fair carryover  Ambulation/Gait Ambulation/Gait assistance: Mod assist Gait Distance (Feet): 3 Feet Assistive device: Rolling walker (2 wheeled) Gait Pattern/deviations: Step-to pattern;Shuffle;Decreased step length - right;Decreased step length - left;Decreased stride length;Trunk flexed Gait velocity: decreased   General Gait Details: slowm unsteady gait looking down most of the time. Patient veers to left and pushes RW too far in front. Limited secondary to incontinent of bowel.   Stairs             Wheelchair Mobility    Modified Rankin (Stroke Patients Only)       Balance Overall balance assessment: Needs assistance Sitting-balance support: Feet unsupported;No upper extremity supported Sitting balance-Leahy Scale: Fair       Standing balance-Leahy Scale: Fair Standing balance comment: fair/poor using RW                            Cognition Arousal/Alertness: Awake/alert Behavior During Therapy: WFL for tasks assessed/performed Overall Cognitive Status: History of cognitive impairments - at baseline  Exercises General Exercises - Lower Extremity Long Arc Quad: Strengthening;Both;10 reps;Seated;AROM Hip Flexion/Marching: Strengthening;Both;10 reps;AROM Toe Raises: Strengthening;Both;10 reps;AROM Heel Raises: Strengthening;Both;10 reps;AROM    General Comments        Pertinent Vitals/Pain Pain  Assessment: Faces Faces Pain Scale: Hurts even more Pain Location: pressure to BLE/feet Pain Descriptors / Indicators: Sore;Grimacing;Guarding Pain Intervention(s): Limited activity within patient's tolerance;Monitored during session    Home Living                      Prior Function            PT Goals (current goals can now be found in the care plan section) Acute Rehab PT Goals Patient Stated Goal: return home with family to assist PT Goal Formulation: With patient/family Time For Goal Achievement: 10/28/18 Potential to Achieve Goals: Good Progress towards PT goals: Progressing toward goals    Frequency    Min 3X/week      PT Plan Current plan remains appropriate    Co-evaluation              AM-PAC PT "6 Clicks" Mobility   Outcome Measure  Help needed turning from your back to your side while in a flat bed without using bedrails?: Total Help needed moving from lying on your back to sitting on the side of a flat bed without using bedrails?: Total Help needed moving to and from a bed to a chair (including a wheelchair)?: Total Help needed standing up from a chair using your arms (e.g., wheelchair or bedside chair)?: A Lot Help needed to walk in hospital room?: A Lot Help needed climbing 3-5 steps with a railing? : A Lot 6 Click Score: 9    End of Session   Activity Tolerance: Patient tolerated treatment well;Patient limited by fatigue Patient left: in chair;with call bell/phone within reach;with family/visitor present Nurse Communication: Mobility status PT Visit Diagnosis: Unsteadiness on feet (R26.81);Other abnormalities of gait and mobility (R26.89);Muscle weakness (generalized) (M62.81)     Time: 1410-1440 PT Time Calculation (min) (ACUTE ONLY): 30 min  Charges:  $Therapeutic Exercise: 8-22 mins $Therapeutic Activity: 8-22 mins                     Floria Raveling. Hartnett-Rands, MS, PT Per [place] [phone] 10/16/2018,  2:51 PM

## 2018-10-16 NOTE — Care Management Important Message (Signed)
Important Message  Patient Details  Name: Gregory Hickman MRN: [id number] Date of Birth: 06-17-1951   Medicare Important Message Given:  Yes    Shelda Altes 10/16/2018, 1:54 PM

## 2018-10-17 ENCOUNTER — Encounter (HOSPITAL_COMMUNITY): Payer: Self-pay | Admitting: *Deleted

## 2018-10-17 ENCOUNTER — Inpatient Hospital Stay (HOSPITAL_COMMUNITY)
Admission: RE | Admit: 2018-10-17 | Discharge: 2018-10-20 | DRG: 640 | Disposition: A | Discharge status: Hospice | Source: Ambulatory Visit | Attending: Internal Medicine | Admitting: Internal Medicine

## 2018-10-17 DIAGNOSIS — R338 Other retention of urine: Secondary | ICD-10-CM | POA: Diagnosis present

## 2018-10-17 DIAGNOSIS — I10 Essential (primary) hypertension: Secondary | ICD-10-CM | POA: Diagnosis present

## 2018-10-17 DIAGNOSIS — R569 Unspecified convulsions: Secondary | ICD-10-CM | POA: Diagnosis present

## 2018-10-17 DIAGNOSIS — Z85118 Personal history of other malignant neoplasm of bronchus and lung: Secondary | ICD-10-CM | POA: Diagnosis not present

## 2018-10-17 DIAGNOSIS — G9340 Encephalopathy, unspecified: Secondary | ICD-10-CM | POA: Diagnosis present

## 2018-10-17 DIAGNOSIS — C7931 Secondary malignant neoplasm of brain: Secondary | ICD-10-CM | POA: Diagnosis present

## 2018-10-17 DIAGNOSIS — C3412 Malignant neoplasm of upper lobe, left bronchus or lung: Secondary | ICD-10-CM

## 2018-10-17 DIAGNOSIS — N401 Enlarged prostate with lower urinary tract symptoms: Secondary | ICD-10-CM | POA: Diagnosis present

## 2018-10-17 DIAGNOSIS — E871 Hypo-osmolality and hyponatremia: Secondary | ICD-10-CM | POA: Diagnosis present

## 2018-10-17 DIAGNOSIS — N179 Acute kidney failure, unspecified: Secondary | ICD-10-CM | POA: Diagnosis present

## 2018-10-17 DIAGNOSIS — Z66 Do not resuscitate: Secondary | ICD-10-CM | POA: Diagnosis present

## 2018-10-17 DIAGNOSIS — G8929 Other chronic pain: Secondary | ICD-10-CM | POA: Diagnosis present

## 2018-10-17 DIAGNOSIS — E43 Unspecified severe protein-calorie malnutrition: Secondary | ICD-10-CM | POA: Diagnosis present

## 2018-10-17 DIAGNOSIS — K5900 Constipation, unspecified: Secondary | ICD-10-CM | POA: Diagnosis present

## 2018-10-17 DIAGNOSIS — E860 Dehydration: Secondary | ICD-10-CM | POA: Diagnosis present

## 2018-10-17 DIAGNOSIS — G893 Neoplasm related pain (acute) (chronic): Secondary | ICD-10-CM | POA: Diagnosis not present

## 2018-10-17 DIAGNOSIS — E876 Hypokalemia: Principal | ICD-10-CM | POA: Diagnosis present

## 2018-10-17 DIAGNOSIS — Z515 Encounter for palliative care: Secondary | ICD-10-CM | POA: Diagnosis not present

## 2018-10-17 DIAGNOSIS — R627 Adult failure to thrive: Secondary | ICD-10-CM | POA: Diagnosis not present

## 2018-10-17 MED ORDER — BIOTENE DRY MOUTH MT LIQD: 15.0000 mL | OROMUCOSAL | Status: DC | PRN

## 2018-10-17 MED ORDER — POLYVINYL ALCOHOL 1.4 % OP SOLN
1.0000 [drp] | Freq: Four times a day (QID) | OPHTHALMIC | Status: DC | PRN
  Administered 2018-10-18: 1 [drp] via OPHTHALMIC
  Filled 2018-10-17: qty 15

## 2018-10-17 MED ORDER — MORPHINE SULFATE (CONCENTRATE) 10 MG/0.5ML PO SOLN: 5.0000 mg | ORAL | Status: DC | PRN

## 2018-10-17 MED ORDER — ONDANSETRON HCL 4 MG/2ML IJ SOLN: 4.0000 mg | Freq: Four times a day (QID) | INTRAMUSCULAR | Status: DC | PRN

## 2018-10-17 MED ORDER — GLYCOPYRROLATE 0.2 MG/ML IJ SOLN: 0.2000 mg | INTRAMUSCULAR | Status: DC | PRN

## 2018-10-17 MED ORDER — HALOPERIDOL LACTATE 5 MG/ML IJ SOLN: 0.5000 mg | INTRAMUSCULAR | Status: DC | PRN

## 2018-10-17 MED ORDER — ONDANSETRON 4 MG PO TBDP: 4.0000 mg | ORAL_TABLET | Freq: Four times a day (QID) | ORAL | Status: DC | PRN

## 2018-10-17 MED ORDER — HALOPERIDOL 0.5 MG PO TABS
0.5000 mg | ORAL_TABLET | ORAL | Status: DC | PRN
  Administered 2018-10-18: 0.5 mg via ORAL
  Filled 2018-10-17: qty 1

## 2018-10-17 MED ORDER — GLYCOPYRROLATE 1 MG PO TABS: 1.0000 mg | ORAL_TABLET | ORAL | Status: DC | PRN

## 2018-10-17 MED ORDER — MORPHINE SULFATE (PF) 2 MG/ML IV SOLN: 1.0000 mg | INTRAVENOUS | Status: DC | PRN

## 2018-10-17 MED ORDER — ACETAMINOPHEN 325 MG PO TABS: 650.0000 mg | ORAL_TABLET | Freq: Four times a day (QID) | ORAL | Status: DC | PRN

## 2018-10-17 MED ORDER — ACETAMINOPHEN 650 MG RE SUPP: 650.0000 mg | Freq: Four times a day (QID) | RECTAL | Status: DC | PRN

## 2018-10-17 MED ORDER — HALOPERIDOL LACTATE 2 MG/ML PO CONC
0.5000 mg | ORAL | Status: DC | PRN
  Filled 2018-10-17: qty 0.3

## 2018-10-17 NOTE — H&P (Signed)
History and Physical    Gregory Hickman RDE:[id number] DOB: [birth year] DOA: 10/17/2018  PCP: Joyice Faster, FNP   Chief Complaint:   HPI:Mr. Simpsonis a 67 y.o.malewith a past medical history significant for BPH, non-small cell lung cancer (affecting left alone, stage IV),HTN, severe protein calorie malnutrition and recent visit to ED due to constipation from narcotics use;who presented on 10/13/18 to the hospital secondary to generalized weakness, altered mental status and poor oral intake. Per family reports the last 3 days and not been eating or drinking anything and today was just too weak to even stand. The change in his mentation is mainly increased somnolence and lethargy. Patient has been experiencing intermittent episode of constant pain and back pain (which is not new and associated with his history of lung cancer).   Patient was noted to have brain metastases with swelling for which she has been started on some Keppra as well as Decadron.  He will now be admitted to general inpatient hospice.   Review of Systems: Cannot be adequately obtained due to patient condition.  Past Medical History:  Diagnosis Date  . BPH (benign prostatic hyperplasia)   . Fracture   . Hypertension   . Metastasis ([place]) 09/02/2018    Past Surgical History:  Procedure Laterality Date  . APPENDECTOMY    . EYE SURGERY  1975  . HAND SURGERY    . HERNIA REPAIR  2015  . PORTACATH PLACEMENT Right 09/13/2018   Procedure: INSERTION PORT-A-CATH;  Surgeon: Aviva Signs, MD;  Location: [facility];  Service: General;  Laterality: Right;     reports that he has been smoking cigarettes. He has been smoking about 0.50 packs per day. He has quit using smokeless tobacco.  His smokeless tobacco use included chew. He reports that he drinks alcohol. He reports that he has current or past drug history. Drugs: Marijuana and "Crack" cocaine.  No Known Allergies  Family History  Problem Relation Age of  Onset  . Colon cancer Neg Hx     Prior to Admission medications   Medication Sig Start Date End Date Taking? Authorizing Provider  amLODipine (NORVASC) 10 MG tablet Take 1 tablet by mouth daily.    [provider]  atorvastatin (LIPITOR) 40 MG tablet Take 1 tablet by mouth at bedtime.     [provider]  BANOPHEN 12.5 MG/5ML liquid Take 12.5 mg by mouth.  09/16/18   [provider]  CARBOPLATIN IV Inject into the vein every 21 ( twenty-one) days.    [provider]  dexamethasone (DECADRON) 4 MG tablet Take 1 tab two times a day the day before [place] chemo, day of chemotherapy and day after chemotherapy for a total of 3 days. 09/27/18   Higgs, Mathis Dad, MD  dutasteride (AVODART) 0.5 MG capsule Take 1 capsule by mouth daily.    [provider]  folic acid (FOLVITE) 1 MG tablet Take 1 tablet (1 mg total) by mouth daily. 08/30/18   Higgs, Mathis Dad, MD  furosemide (LASIX) 20 MG tablet Take 2 tablets (40 mg total) by mouth daily. 10/08/18   Higgs, Mathis Dad, MD  lidocaine-prilocaine (EMLA) cream Apply small amount to port site and cover with plastic wrap one hour prior to appointment. 09/04/18   Higgs, Mathis Dad, MD  lisinopril-hydrochlorothiazide (PRINZIDE,ZESTORETIC) 20-12.5 MG tablet Take 1 tablet by mouth 2 (two) times daily.     [provider]  magic mouthwash SOLN Take 5 mLs by mouth 4 (four) times daily. Swish and spit 09/16/18  Higgs, Mathis Dad, MD  morphine (MS CONTIN) 30 MG 12 hr tablet Take 1 tablet (30 mg total) by mouth every 12 (twelve) hours. 09/16/18   Higgs, Mathis Dad, MD  morphine (MSIR) 30 MG tablet TAKE  (1)  TABLET  EVERY FOUR HOURS AS NEEDED FOR SEVERE PAIN.    - MAY MAKE DROWSY - 10/07/18   Higgs, Mathis Dad, MD  nabumetone (RELAFEN) 750 MG tablet Take 1 tablet by mouth 2 (two) times daily.    [provider]  omeprazole (PRILOSEC) 20 MG capsule Take 1 capsule by mouth daily.    [provider]  Pembrolizumab (KEYTRUDA IV) Inject  into the vein every 21 ( twenty-one) days.    [provider]  PEMEtrexed Disodium (ALIMTA IV) Inject into the vein every 21 ( twenty-one) days.    [provider]  prochlorperazine (COMPAZINE) 10 MG tablet Take 1 tablet (10 mg total) by mouth every 6 (six) hours as needed (Nausea or vomiting). 09/04/18   Higgs, Mathis Dad, MD    Physical Exam: There were no vitals filed for this visit.  Constitutional: NAD, calm, comfortable There were no vitals filed for this visit. Eyes: lids and conjunctivae normal ENMT: Mucous membranes are moist.  Neck: normal, supple Respiratory: clear to auscultation bilaterally. Normal respiratory effort. No accessory muscle use.  Cardiovascular: Regular rate and rhythm, no murmurs. No extremity edema. Abdomen: no tenderness, no distention. Bowel sounds positive.  Musculoskeletal:  No joint deformity upper and lower extremities.   Skin: no rashes, lesions, ulcers.  Psychiatric: Normal judgment and insight. Alert and oriented x 3. Normal mood.   Labs on Admission: I have personally reviewed following labs and imaging studies  CBC: Recent Labs  Lab 10/13/18 0925 10/15/18 0106  WBC 2.5* 2.9*  NEUTROABS 2.1 2.3  HGB 10.2* 9.4*  HCT 30.0* 29.4*  MCV 91.2 96.1  PLT 229 979*   Basic Metabolic Panel: Recent Labs  Lab 10/13/18 0925 10/14/18 0424 10/15/18 0106  NA 127* 133* 130*  K 2.3* 2.7* 4.7  CL 78* 91* 100  CO2 38* 33* 20*  GLUCOSE 92 98 128*  BUN 22 16 14   CREATININE 1.25* 0.78 0.77  CALCIUM 11.2* 9.0 8.4*  MG 1.9  --  1.3*  PHOS  --  2.9  --    GFR: Estimated Creatinine Clearance: 54.6 mL/min (by C-G formula based on SCr of 0.77 mg/dL). Liver Function Tests: Recent Labs  Lab 10/15/18 0106  AST 41  ALT 17  ALKPHOS 101  BILITOT 0.4  PROT 5.9*  ALBUMIN 2.4*   No results for input(s): LIPASE, AMYLASE in the last 168 hours. No results for input(s): AMMONIA in the last 168 hours. Coagulation Profile: Recent Labs  Lab  10/15/18 0106  INR 0.96   Cardiac Enzymes: Recent Labs  Lab 10/15/18 0106  TROPONINI 0.05*   BNP (last 3 results) No results for input(s): PROBNP in the last 8760 hours. HbA1C: No results for input(s): HGBA1C in the last 72 hours. CBG: Recent Labs  Lab 10/13/18 0859 10/15/18 0049  GLUCAP 102* 126*   Lipid Profile: No results for input(s): CHOL, HDL, LDLCALC, TRIG, CHOLHDL, LDLDIRECT in the last 72 hours. Thyroid Function Tests: No results for input(s): TSH, T4TOTAL, FREET4, T3FREE, THYROIDAB in the last 72 hours. Anemia Panel: No results for input(s): VITAMINB12, FOLATE, FERRITIN, TIBC, IRON, RETICCTPCT in the last 72 hours. Urine analysis:    Component Value Date/Time   COLORURINE STRAW (A) 10/13/2018 [place] 10/13/2018 8921  LABSPEC 1.005 10/13/2018 0925   PHURINE 7.0 10/13/2018 0925   GLUCOSEU NEGATIVE 10/13/2018 0925   HGBUR SMALL (A) 10/13/2018 0925   BILIRUBINUR NEGATIVE 10/13/2018 0925   KETONESUR NEGATIVE 10/13/2018 0925   PROTEINUR NEGATIVE 10/13/2018 0925   NITRITE NEGATIVE 10/13/2018 0925   LEUKOCYTESUR NEGATIVE 10/13/2018 0925    Radiological Exams on Admission: No results found.   Assessment/Plan Active Problems:   Encephalopathy    1-Severe hypokalemia-resolved: in the setting of dehydration, poor oral intake, and GI losses (patient received treatment with laxatives and enemas for severe constipation 3 days prior to admission) -K at4.7today -DC further IV fluids and further lab work. -Palliative care hasseen the patient and after discussing with family the decision is for DNR/DNI and no feeding tube. Based on further clinical response plan is for home hospice vs residential hospice -Overall prognosis is very poor.  2-Severe protein calorie malnutrition -After discussion with palliative care decision has been made for no feeding tube or artificial nutrition. -Continue supportive carewith comfort feeds  3-Malignant  neoplasm of upper lobe of left lung ([place]): due to non-small cell lung cancer stage IV. -Patient was actively receiving chemotherapy -Last chemotherapy treatment was on 10/07/2018 -Overall with very poor prognosis -Appreciate palliative care consultation will follow recommendations. -For now patient is DNR/DNI, no feeding tube  4-Hyponatremia/Hypercalcemia-resolved:Calcium was 11.2 on admission -In the setting of severe dehydration -After fluid resuscitation given calcium level 8.4  5-Acute kidney injury -Cr on admission was 1.25; currently at 0.77which is his baseline -Most likely prerenal in nature due to dehydration and continued use of nephrotoxic agents -NSAIDs have been discontinued -Will continue to hold diuretics and ACE inhibitors -DC IV fluid -No signs of infection on UA  6-Essential hypertension -Patients blood pressure is back to normal range -Will continue to hold antihypertensive agents -DC IV fluid -Solu-Cortefdiscontinued,cortisol at 24.6  7-Chronic pain -Most likely associated with ongoing metastatic lesions -Will continue as needed pain medication -Will continue daily linzess for constipation -Recommend home suppositories as needed  8-Somnolence/lethargy -Most likely associated with recent adjustment to his pain medication in the setting of acute kidney injury from dehydration.  -Pain medication has been adjusted given his blood pressure levels -Will follow clinical response -Keep patient comfortable  9-New onset seizure; possibly secondary to brain metastases/NSCLC -Patientnow on oral Decadron and oral Keppra   DVT prophylaxis: SCDs Code Status: DNR Family Communication: Discussed with sister and brother bedside Disposition Plan: General inpatient hospice Consults called: Palliative care Admission status: Inpatient, MedSurg   Pratik Darleen Crocker DO [facility] Pager [phone]  If 7PM-7AM, please contact  night-coverage [email or web address] Password [email or web address]  10/17/2018, 3:04 PM

## 2018-10-17 NOTE — Progress Notes (Signed)
Patient's sister came up to clinic today to inform us that he was going to hospice home here in [facility].  She states that they are awaiting a bed for him to go to.  She has unrealistic idea of him getting better.  I talked with her about hospice and how he would no longer be getting chemotherapy and she said she understood but if he gets better they would want to restart treatment. I explained to her that according to the notes, his plan of care is now more on palliative / comfort care.  When she left the clinic, she verbalizes appreciation for our care for her brother.  I told her to call us if she should need anything from [place] moving forward.

## 2018-10-17 NOTE — Progress Notes (Signed)
Daily Progress Note   Patient Name: Gregory Hickman       Date: 10/17/2018 DOB: 12-10-1950  Age: 67 y.o. MRN#: [id number] Attending Physician: Rodena Goldmann, DO Primary Care Physician: Joyice Faster, FNP Admit Date: 10/13/2018  Reason for Consultation/Follow-up: Establishing goals of care  Subjective/GOC: Patient awake, alert, talking with hospice liaisons at bedside. Sister, Hilda Blades at bedside. Discussing plan for initiation of hospice services, for which patient initially reluctant to the fact that he will be going to a facility. After further discussion, patient understands that he needs caregivers and not safe to be alone. Sister called many family members to speak with Ronalee Belts as well.   Discussed with hospice liaison. Answered questions and concerns. Sister has PMT contact information.   Length of Stay: 4  Current Medications: Scheduled Meds:  . dexamethasone  4 mg Oral Q6H  . dutasteride  0.5 mg Oral Daily  . feeding supplement (ENSURE ENLIVE)  237 mL Oral TID BM  . feeding supplement (PRO-STAT SUGAR FREE 64)  30 mL Oral BID  . folic acid  1 mg Oral Daily  . levETIRAcetam  500 mg Oral BID  . linaclotide  145 mcg Oral QAC breakfast  . morphine  30 mg Oral Q12H  . pantoprazole  40 mg Oral Daily    Continuous Infusions:   PRN Meds: acetaminophen **OR** acetaminophen, LORazepam, morphine injection, ondansetron **OR** ondansetron (ZOFRAN) IV  Physical Exam  Constitutional: He is easily aroused. He appears ill.  HENT:  Head: Normocephalic and atraumatic.  Pulmonary/Chest: No accessory muscle usage. No tachypnea. No respiratory distress.  Abdominal: There is no tenderness.  Neurological: He is alert and easily aroused.  Reoriented to time/place.   Skin: Skin is warm and  dry.  Psychiatric: His speech is delayed. He is inattentive.  Nursing note and vitals reviewed.          Vital Signs: BP (!) 155/87 (BP Location: Left Arm)   Pulse 64   Temp (!) 97.4 F (36.3 C) (Oral)   Resp 18   Ht 5\' 6"  (1.676 m)   Wt 43.7 kg   SpO2 98%   BMI 15.55 kg/m  SpO2: SpO2: 98 % O2 Device: O2 Device: Room Air O2 Flow Rate: O2 Flow Rate (L/min): 2 L/min  Intake/output summary:   Intake/Output Summary (Last  24 hours) at 10/17/2018 1108 Last data filed at 10/17/2018 0900 Gross per 24 hour  Intake 480 ml  Output 2003 ml  Net -1523 ml   LBM: Last BM Date: 10/16/18 Baseline Weight: Weight: 43.1 kg Most recent weight: Weight: 43.7 kg       Palliative Assessment/Data: PPS 40%   Flowsheet Rows     Most Recent Value  Intake Tab  Referral Department  Hospitalist  Unit at Time of Referral  Med/Surg Unit  Palliative Care Primary Diagnosis  Cancer  Date Notified  10/13/18  Palliative Care Type  New Palliative care  Reason for referral  Clarify Goals of Care  Date of Admission  10/13/18  Date first seen by Palliative Care  10/15/18  # of days Palliative referral response time  2 Day(s)  # of days IP prior to Palliative referral  0  Clinical Assessment  Palliative Performance Scale Score  40%  Psychosocial & Spiritual Assessment  Palliative Care Outcomes  Patient/Family meeting held?  Yes  Who was at the meeting?  patient and sister  Palliative Care Outcomes  Clarified goals of care, Counseled regarding hospice, Provided end of life care assistance, Provided advance care planning, Provided psychosocial or spiritual support, ACP counseling assistance      Patient Active Problem List   Diagnosis Date Noted  . Weakness   . Palliative care by specialist   . Terminal care   . Seizure ([place])   . Cancer related pain   . Hypokalemia 10/13/2018  . Severe protein-calorie malnutrition ([place]) 10/13/2018  . Dehydration 10/13/2018  . Hyponatremia 10/13/2018  .  Hypercalcemia 10/13/2018  . AKI (acute kidney injury) ([place]) 10/13/2018  . 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 Metastatic cancer ([place]) 09/02/2018  . 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 lung ([place]) 08/28/2018  . Anemia 04/09/2018  . Dark stools 04/09/2018  . RECTAL BLEEDING 05/31/2010  . ANOREXIA 05/31/2010  . WEIGHT LOSS, RECENT 05/31/2010    Palliative Care Assessment & Plan   Patient Profile: 67 y.o. male  with past medical history of HTN, BPH, and metastatic non-small cell lung cancer admitted on 10/13/2018 with weakness and lethargy. Patient recently diagnosed with stage IV non-small cell lung cancer with mets to multiple areas including brain, bone, and liver. Completed whole brain radiation. Currently receiving chemotherapy, last give on 11/25. In ED, patient with hypokalemia, AKI, dehydration, and hypercalcemia. Seizure activity early AM on 12/3. Palliative medicine consultation for goals of care.    Assessment: Metastatic non-small cell lung cancer Severe protein calorie malnutrition Severe hypokalemia Constipation Hyponatremia Hypercalcemia Acute kidney injury Chronic cancer related pain New onset seizure  Recommendations/Plan:  DNR/DNI. NO feeding tube. NO further chemotherapy.   Focus on comfort and quality of life.   Discussed hospice options and philosophy in detail. Plan is for discharge to residential hospice [facility]). SW notified.   Symptom management  Continue MS Contin 30mg  BID  Continue prn IV or SL morphine.  Linzess daily   Continue Keppra and prn IV ativan for seizures  Recommend continuing decadron for comfort  Code Status: DNR   Code Status Orders  (From admission, onward)         Start     Ordered   10/13/18 1643  Do not attempt resuscitation (DNR)  Continuous    Question Answer Comment  In the event of cardiac or respiratory ARREST Do not call a "code blue"   In the event of cardiac  or respiratory  ARREST Do not perform Intubation, CPR, defibrillation or ACLS   In the event of cardiac or respiratory ARREST Use medication by any route, position, wound care, and other measures to relive pain and suffering. May use oxygen, suction and manual treatment of airway obstruction as needed for comfort.      10/13/18 1648        Code Status History    Date Active Date Inactive Code Status Order ID Comments User Context   10/13/2018 1629 10/13/2018 1648 DNR [id number]  Barton Dubois, MD Inpatient       Prognosis:   Poor prognosis likely weeks secondary to metastatic lung cancer and declining functional/nutritional status.   Discharge Planning:   Hospice facility  Care plan was discussed with patient, sister, hospice liaisons  Thank you for allowing the Palliative Medicine Team to assist in the care of this patient.   Time In: 1045 Time Out: 1110 Total Time 25 Prolonged Time Billed no      Greater than 50%  of this time was spent counseling and coordinating care related to the above assessment and plan.  Ihor Dow, FNP-C Palliative Medicine Team  Phone: [phone] Fax: [phone]  Please contact Palliative Medicine Team phone at [phone] for questions and concerns.

## 2018-10-17 NOTE — Discharge Summary (Signed)
Physician Discharge Summary  FINNIAN HUSTED YQI:[id number] DOB: [birth year] DOA: 10/13/2018  PCP: Joyice Faster, FNP  Admit date: 10/13/2018  Discharge date: 10/17/2018  Admitted From:Home  Disposition:  GIP hospice  CODE STATUS: DNR  Diet recommendation: soft/comfort feeds  Brief/Interim Summary: Mr. Chaput a 67 y.o.malewith a past medical history significant for BPH, non-small cell lung cancer (affecting left alone, stage IV),HTN, severe protein calorie malnutrition and recent visit to ED due to constipation from narcotics use;who presented on 10/13/18 to the hospital secondary to generalized weakness, altered mental status and poor oral intake. Per family reports the last 3 days and not been eating or drinking anything and today was just too weak to even stand. The change in his mentation is mainly increased somnolence and lethargy. Patient has been experiencing intermittent episode of constant pain and back pain (which is not new and associated with his history of lung cancer).   Patient was noted to have brain metastases with swelling for which she has been started on some Keppra as well as Decadron.  He will now be admitted to general inpatient hospice.  1-Severe hypokalemia-resolved: in the setting of dehydration, poor oral intake, and GI losses (patient received treatment with laxatives and enemas for severe constipation 3 days prior to admission) -K at4.7today -DC further IV fluids and further lab work. -Palliative care hasseen the patient and after discussing with family the decision is for DNR/DNI and no feeding tube. Based on further clinical response plan is for home hospice vs residential hospice -Overall prognosis is very poor.  2-Severe protein calorie malnutrition -After discussion with palliative care decision has been made for no feeding tube or artificial nutrition. -Continue supportive care with comfort feeds  3-Malignant neoplasm of upper lobe  of left lung ([place]): due to non-small cell lung cancer stage IV. -Patient was actively receiving chemotherapy -Last chemotherapy treatment was on 10/07/2018 -Overall with very poor prognosis -Appreciate palliative care consultation will follow recommendations. -For now patient is DNR/DNI, no feeding tube  4-Hyponatremia/Hypercalcemia-resolved:Calcium was 11.2 on admission -In the setting of severe dehydration -After fluid resuscitation given calcium level 8.4  5-Acute kidney injury -Cr on admission was 1.25; currently at 0.77which is his baseline -Most likely prerenal in nature due to dehydration and continued use of nephrotoxic agents -NSAIDs have been discontinued -Will continue to hold diuretics and ACE inhibitors -DC IV fluid -No signs of infection on UA  6-Essential hypertension -Patients blood pressure is back to normal range -Will continue to hold antihypertensive agents -DC IV fluid -Solu-Cortefdiscontinued,cortisol at 24.6  7-Chronic pain -Most likely associated with ongoing metastatic lesions -Will continue as needed pain medication -Will continue daily linzess for constipation -Recommend home suppositories as needed  8-Somnolence/lethargy -Most likely associated with recent adjustment to his pain medication in the setting of acute kidney injury from dehydration.  -Pain medication has been adjusted given his blood pressure levels -Will follow clinical response -Keep patient comfortable  9-New onset seizure; possibly secondary to brain metastases/NSCLC -Patient now on oral Decadron and oral Keppra  Discharge Diagnoses:  Active Problems:   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 lung (HCC)   Metastatic cancer (HCC)   Hypokalemia   Severe protein-calorie malnutrition (HCC)   Dehydration   Hyponatremia   Hypercalcemia   AKI (acute kidney injury) ([place])   Palliative care by specialist   Terminal care   Seizure [facility])   Cancer related pain    Weakness    No Known Allergies  Consultations:  Palliative Care  Procedures/Studies: Dg Chest 2 View  Result Date: 10/03/2018 CLINICAL DATA:  Non-small cell lung cancer. EXAM: CHEST - 2 VIEW COMPARISON:  09/13/2018.  CT 07/24/2018 FINDINGS: Right Port-A-Cath is in place with the tip in the SVC. Left upper lobe mass is again noted, likely decreased in size since prior CT. There are numerous bilateral pulmonary nodules compatible with metastases. No effusions. Heart is normal size. No acute bony abnormality. IMPRESSION: Left upper lobe mass compatible with patient's known history of lung cancer. Numerous bilateral pulmonary nodules compatible with metastases. Electronically Signed   By: Rolm Baptise M.D.   On: 10/03/2018 11:43   Dg Abdomen 1 View  Result Date: 10/11/2018 CLINICAL DATA:  Constipation and rectal pain. EXAM: ABDOMEN - 1 VIEW COMPARISON:  PET-CT dated August 12, 2018. FINDINGS: The bowel gas pattern is normal. Large amount of stool in the colon and rectum. No radio-opaque calculi or other significant radiographic abnormality are seen. No acute osseous abnormality. IMPRESSION: 1. Prominent stool throughout the colon and rectum, consistent with constipation. Correlate for fecal impaction. 2. No obstruction. Electronically Signed   By: Titus Dubin M.D.   On: 10/11/2018 10:00   Ct Head Wo Contrast  Result Date: 10/15/2018 CLINICAL DATA:  New onset seizure EXAM: CT HEAD WITHOUT CONTRAST TECHNIQUE: Contiguous axial images were obtained from the base of the skull through the vertex without intravenous contrast. COMPARISON:  MRI 08/12/2018 FINDINGS: Brain: Vasogenic edema noted within the right temporal lobe and parietal lobe, left occipital lobe in the area of previously seen metastases on MRI. 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 Chronic small vessel disease throughout the deep white matter. No hemorrhage or acute infarction. No hydrocephalus. Vascular: No  hyperdense vessel or unexpected calcification. Skull: No acute calvarial abnormality. Sinuses/Orbits: Visualized paranasal sinuses and mastoids clear. Orbital soft tissues unremarkable. Other: None IMPRESSION: Edema related to widespread metastases as seen on prior MRI, unchanged. 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 No acute infarct or hemorrhage. Atrophy, chronic small vessel disease. Electronically Signed   By: Rolm Baptise M.D.   On: 10/15/2018 02:17   Dg Chest Port 1 View  Result Date: 10/15/2018 CLINICAL DATA:  New onset seizures.  History of lung cancer. EXAM: PORTABLE CHEST 1 VIEW COMPARISON:  Chest radiograph October 03, 2018 FINDINGS: Cardiac silhouette is normal size. Diffuse interstitial prominence with multiple pulmonary nodules, unchanged. Stable LEFT perihilar mass. No pleural effusion. No pneumothorax. Single-lumen RIGHT chest Port-A-Cath with distal tip projecting mid superior vena cava. Soft tissue planes and included osseous structures are unchanged. Coarse vascular calcifications in the neck. IMPRESSION: 1. No acute cardiopulmonary process. 2. Redemonstration of LEFT perihilar mass with multiple pulmonary nodules/metastasis. Electronically Signed   By: Elon Alas M.D.   On: 10/15/2018 01:29     Discharge Exam: Vitals:   10/16/18 2054 10/17/18 0539  BP: (!) 141/77 (!) 155/87  Pulse: 67 64  Resp: 20 18  Temp: 98 F (36.7 C) (!) 97.4 F (36.3 C)  SpO2: 94% 98%   Vitals:   10/15/18 2229 10/16/18 0700 10/16/18 2054 10/17/18 0539  BP: 120/73 120/65 (!) 141/77 (!) 155/87  Pulse: 76 70 67 64  Resp: 16 18 20 18   Temp: 97.9 F (36.6 C) 98 F (36.7 C) 98 F (36.7 C) (!) 97.4 F (36.3 C)  TempSrc: Oral Oral Oral Oral  SpO2: 100% 100% 94% 98%  Weight:      Height:        General: Pt is alert, awake, not in acute  distress Cardiovascular: RRR, S1/S2 +, no rubs, no gallops Respiratory: CTA bilaterally, no wheezing, no rhonchi Abdominal: Soft, NT, ND, bowel sounds  + Extremities: no edema, no cyanosis    The results of significant diagnostics from this hospitalization (including imaging, microbiology, ancillary and laboratory) are listed below for reference.     Microbiology: No results found for this or any previous visit (from the past 240 hour(s)).   Labs: BNP (last 3 results) No results for input(s): BNP in the last 8760 hours. Basic Metabolic Panel: Recent Labs  Lab 10/13/18 0925 10/14/18 0424 10/15/18 0106  NA 127* 133* 130*  K 2.3* 2.7* 4.7  CL 78* 91* 100  CO2 38* 33* 20*  GLUCOSE 92 98 128*  BUN 22 16 14   CREATININE 1.25* 0.78 0.77  CALCIUM 11.2* 9.0 8.4*  MG 1.9  --  1.3*  PHOS  --  2.9  --    Liver Function Tests: Recent Labs  Lab 10/15/18 0106  AST 41  ALT 17  ALKPHOS 101  BILITOT 0.4  PROT 5.9*  ALBUMIN 2.4*   No results for input(s): LIPASE, AMYLASE in the last 168 hours. No results for input(s): AMMONIA in the last 168 hours. CBC: Recent Labs  Lab 10/13/18 0925 10/15/18 0106  WBC 2.5* 2.9*  NEUTROABS 2.1 2.3  HGB 10.2* 9.4*  HCT 30.0* 29.4*  MCV 91.2 96.1  PLT 229 148*   Cardiac Enzymes: Recent Labs  Lab 10/15/18 0106  TROPONINI 0.05*   BNP: Invalid input(s): POCBNP CBG: Recent Labs  Lab 10/13/18 0859 10/15/18 0049  GLUCAP 102* 126*   D-Dimer No results for input(s): DDIMER in the last 72 hours. Hgb A1c No results for input(s): HGBA1C in the last 72 hours. Lipid Profile No results for input(s): CHOL, HDL, LDLCALC, TRIG, CHOLHDL, LDLDIRECT in the last 72 hours. Thyroid function studies No results for input(s): TSH, T4TOTAL, T3FREE, THYROIDAB in the last 72 hours.  Invalid input(s): FREET3 Anemia work up No results for input(s): VITAMINB12, FOLATE, FERRITIN, TIBC, IRON, RETICCTPCT in the last 72 hours. Urinalysis    Component Value Date/Time   COLORURINE STRAW (A) 10/13/2018 0925   APPEARANCEUR CLEAR 10/13/2018 0925   LABSPEC 1.005 10/13/2018 0925   PHURINE 7.0 10/13/2018 0925    GLUCOSEU NEGATIVE 10/13/2018 0925   HGBUR SMALL (A) 10/13/2018 0925   BILIRUBINUR NEGATIVE 10/13/2018 0925   KETONESUR NEGATIVE 10/13/2018 0925   PROTEINUR NEGATIVE 10/13/2018 0925   NITRITE NEGATIVE 10/13/2018 0925   LEUKOCYTESUR NEGATIVE 10/13/2018 0925   Sepsis Labs Invalid input(s): PROCALCITONIN,  WBC,  LACTICIDVEN Microbiology No results found for this or any previous visit (from the past 240 hour(s)).   Time coordinating discharge: 35 minutes  SIGNED:   Rodena Goldmann, DO [facility] 10/17/2018, 1:08 PM Pager [phone]  If 7PM-7AM, please contact night-coverage [email or web address] Password [email or web address]

## 2018-10-18 ENCOUNTER — Encounter: Payer: Self-pay | Admitting: Radiation Oncology

## 2018-10-18 DIAGNOSIS — Z515 Encounter for palliative care: Secondary | ICD-10-CM

## 2018-10-18 MED ORDER — DUTASTERIDE 0.5 MG PO CAPS
0.5000 mg | ORAL_CAPSULE | Freq: Every day | ORAL | Status: DC
  Administered 2018-10-18 – 2018-10-20 (×3): 0.5 mg via ORAL
  Filled 2018-10-18 (×4): qty 1

## 2018-10-18 MED ORDER — FOLIC ACID 1 MG PO TABS
1.0000 mg | ORAL_TABLET | Freq: Every day | ORAL | Status: DC
  Administered 2018-10-18 – 2018-10-20 (×3): 1 mg via ORAL
  Filled 2018-10-18 (×3): qty 1

## 2018-10-18 MED ORDER — ONDANSETRON HCL 4 MG PO TABS: 4.0000 mg | ORAL_TABLET | Freq: Four times a day (QID) | ORAL | Status: DC | PRN

## 2018-10-18 MED ORDER — LEVETIRACETAM 100 MG/ML PO SOLN
500.0000 mg | Freq: Two times a day (BID) | ORAL | Status: DC
  Administered 2018-10-18 – 2018-10-20 (×5): 500 mg via ORAL
  Filled 2018-10-18 (×7): qty 5

## 2018-10-18 MED ORDER — LORAZEPAM 2 MG/ML IJ SOLN: 1.0000 mg | INTRAMUSCULAR | Status: DC | PRN

## 2018-10-18 MED ORDER — MORPHINE SULFATE ER 30 MG PO TBCR
30.0000 mg | EXTENDED_RELEASE_TABLET | Freq: Two times a day (BID) | ORAL | Status: DC
  Administered 2018-10-18 – 2018-10-20 (×5): 30 mg via ORAL
  Filled 2018-10-18 (×5): qty 1

## 2018-10-18 MED ORDER — LINACLOTIDE 145 MCG PO CAPS
145.0000 ug | ORAL_CAPSULE | Freq: Every day | ORAL | Status: DC
  Administered 2018-10-19 – 2018-10-20 (×2): 145 ug via ORAL
  Filled 2018-10-18 (×3): qty 1

## 2018-10-18 MED ORDER — DEXAMETHASONE 4 MG PO TABS
4.0000 mg | ORAL_TABLET | Freq: Four times a day (QID) | ORAL | Status: DC
  Administered 2018-10-18: 4 mg via ORAL
  Filled 2018-10-18: qty 1

## 2018-10-18 MED ORDER — ONDANSETRON HCL 4 MG/2ML IJ SOLN: 4.0000 mg | Freq: Four times a day (QID) | INTRAMUSCULAR | Status: DC | PRN

## 2018-10-18 MED ORDER — MORPHINE SULFATE (CONCENTRATE) 10 MG/0.5ML PO SOLN: 5.0000 mg | ORAL | Status: DC | PRN

## 2018-10-18 MED ORDER — MORPHINE SULFATE (PF) 2 MG/ML IV SOLN
1.0000 mg | INTRAVENOUS | Status: DC | PRN
  Administered 2018-10-18 – 2018-10-20 (×7): 1 mg via INTRAVENOUS
  Filled 2018-10-18 (×7): qty 1

## 2018-10-18 MED ORDER — ACETAMINOPHEN 650 MG RE SUPP: 650.0000 mg | Freq: Four times a day (QID) | RECTAL | Status: DC | PRN

## 2018-10-18 MED ORDER — ACETAMINOPHEN 325 MG PO TABS: 650.0000 mg | ORAL_TABLET | Freq: Four times a day (QID) | ORAL | Status: DC | PRN

## 2018-10-18 MED ORDER — DEXAMETHASONE 4 MG PO TABS
4.0000 mg | ORAL_TABLET | Freq: Three times a day (TID) | ORAL | Status: DC
  Administered 2018-10-18 – 2018-10-20 (×6): 4 mg via ORAL
  Filled 2018-10-18 (×6): qty 1

## 2018-10-18 MED ORDER — ENSURE ENLIVE PO LIQD
237.0000 mL | Freq: Three times a day (TID) | ORAL | Status: DC
  Administered 2018-10-18 – 2018-10-19 (×5): 237 mL via ORAL

## 2018-10-18 MED ORDER — PRO-STAT SUGAR FREE PO LIQD
30.0000 mL | Freq: Two times a day (BID) | ORAL | Status: DC
  Administered 2018-10-18 – 2018-10-19 (×4): 30 mL via ORAL
  Filled 2018-10-18 (×5): qty 30

## 2018-10-18 NOTE — Progress Notes (Signed)
Nutrition Brief Note  Chart reviewed.   Pt now transitioning to Hospice care.     There is no height or weight on file to calculate BMI.    Receiving comfort feeds. He does not wish alternate nutrition support.   No further nutrition interventions warranted at this time.   Colman Cater MS,RD,CSG,LDN Office: [phone] Pager: [phone]

## 2018-10-18 NOTE — Progress Notes (Signed)
PROGRESS NOTE    Gregory Hickman  UUV:[id number] DOB: 10-02-1951 DOA: 10/17/2018 PCP: Joyice Faster, FNP     Brief Narrative:  68 y.o.malewith a past medical history significant for BPH, non-small cell lung cancer (affecting left alone, stage IV),HTN, severe protein calorie malnutrition and recent visit to ED due to constipation from narcotics use;who presented on 10/13/18 to the hospital secondary to generalized weakness, altered mental status and poor oral intake. Per family reports the last 3 days and not been eating or drinking anything and today was just too weak to even stand. The change in his mentation is mainly increased somnolence and lethargy. Patient has been experiencing intermittent episode of constant pain and back pain (which is not new and associated with his history of lung cancer).   Patient was noted to have brain metastases with swelling for which she has been started on some Keppra as well as Decadron. He will now be admitted to general inpatient hospice.  Assessment & Plan: 1-severe hypokalemia: Resolved for the last blood work that was done -Patient no eating or drinking in the setting of failure to thrive from lung cancer -At this moment continue comfort feeding -No further blood work.  2-severe protein calorie malnutrition -As mentioned above decision has been made to transition to full comfort care -Continue comfort feeding only.  3-malignant neoplasm of upper lobe of the left lung: Due to non-small cell lung cancer stage IV -Patient was actively receiving chemotherapy and his last chemo treatment was on 10/07/2018. -At this moment his prognosis is very poor and he will have less than 4 weeks life expectancy -After discussion with family members goals of care and advance directive decision has remained terminate patient DNR/DNI none focus on comfort feeding -No tube feedings or any further invasive  intervention.  4-hyponatremia/hypercalcemia -Calcium was 11.2 on admission and his sodium was 127 -He received fluid resuscitation with resolution of these abnormal findings and electrolytes from his last blood work -For now no further blood work will be done.  5-acute kidney injury -Creatinine on admission was 1.25 -After fluid resuscitation down to 0.77 which is his baseline -No further blood work will be done -Plan is for comfort care.  6-new episode of seizure -Associated with metastatic lesions to his brain -Patient started on Decadron and Keppra as part of comfort to prevent further seizure activity.  7-BPH/urinary retention -Continue Avodart -Insert foley as part of comfort and end of life care.  DVT prophylaxis: None (comfort care/GIP admission). Code Status: DNR/DNI Family Communication: Sister and brother-in-law at bedside. Disposition Plan: Continue full comfort care and wait for hospice bed.  Consultants:   Palliative care  Hospice  Procedures:   See below for x-ray reports  Antimicrobials:  Anti-infectives (From admission, onward)   None      Subjective: Afebrile, no shortness of breath, no nausea or vomiting.  Patient in no acute distress.  Reporting some pain between his shoulder blades.  Objective: There were no vitals filed for this visit.  Intake/Output Summary (Last 24 hours) at 10/18/2018 1502 Last data filed at 10/18/2018 0643 Gross per 24 hour  Intake 240 ml  Output 2000 ml  Net -1760 ml   There were no vitals filed for this visit.  Examination: General exam: Alert, awake, oriented x 2; reporting some pain between shoulder blades.  Patient is cachectic on exam, very frail and currently in no distress. Respiratory system: Normal respiratory effort; no wheezing, positive scattered rhonchi. Cardiovascular system: Rate controlled, no murmurs,  no gallops, no rubs. Gastrointestinal system: Abdomen is nondistended, soft and nontender. No  organomegaly or masses felt. Normal bowel sounds heard. Central nervous system: Alert and oriented. No focal neurological deficits. Extremities: No C/C/E, +pedal pulses Skin: No rashes, lesions or ulcers Psychiatry: Mood is a stable.  Data Reviewed: I have personally reviewed following labs and imaging studies  CBC: Recent Labs  Lab 10/13/18 0925 10/15/18 0106  WBC 2.5* 2.9*  NEUTROABS 2.1 2.3  HGB 10.2* 9.4*  HCT 30.0* 29.4*  MCV 91.2 96.1  PLT 229 254*   Basic Metabolic Panel: Recent Labs  Lab 10/13/18 0925 10/14/18 0424 10/15/18 0106  NA 127* 133* 130*  K 2.3* 2.7* 4.7  CL 78* 91* 100  CO2 38* 33* 20*  GLUCOSE 92 98 128*  BUN 22 16 14   CREATININE 1.25* 0.78 0.77  CALCIUM 11.2* 9.0 8.4*  MG 1.9  --  1.3*  PHOS  --  2.9  --    GFR: Estimated Creatinine Clearance: 54.6 mL/min (by C-G formula based on SCr of 0.77 mg/dL).   Liver Function Tests: Recent Labs  Lab 10/15/18 0106  AST 41  ALT 17  ALKPHOS 101  BILITOT 0.4  PROT 5.9*  ALBUMIN 2.4*   Coagulation Profile: Recent Labs  Lab 10/15/18 0106  INR 0.96   Cardiac Enzymes: Recent Labs  Lab 10/15/18 0106  TROPONINI 0.05*   CBG: Recent Labs  Lab 10/13/18 0859 10/15/18 0049  GLUCAP 102* 126*   Urine analysis:    Component Value Date/Time   COLORURINE STRAW (A) 10/13/2018 0925   APPEARANCEUR CLEAR 10/13/2018 0925   LABSPEC 1.005 10/13/2018 0925   PHURINE 7.0 10/13/2018 0925   GLUCOSEU NEGATIVE 10/13/2018 0925   HGBUR SMALL (A) 10/13/2018 0925   BILIRUBINUR NEGATIVE 10/13/2018 0925   KETONESUR NEGATIVE 10/13/2018 0925   PROTEINUR NEGATIVE 10/13/2018 0925   NITRITE NEGATIVE 10/13/2018 0925   LEUKOCYTESUR NEGATIVE 10/13/2018 0925   Scheduled Meds: . dexamethasone  4 mg Oral Q8H  . dutasteride  0.5 mg Oral Daily  . feeding supplement (ENSURE ENLIVE)  237 mL Oral TID BM  . feeding supplement (PRO-STAT SUGAR FREE 64)  30 mL Oral BID  . folic acid  1 mg Oral Daily  . levETIRAcetam  500 mg  Oral BID  . [START ON 10/19/2018] linaclotide  145 mcg Oral QAC breakfast  . morphine  30 mg Oral Q12H   Continuous Infusions:   LOS: 1 day    Time spent: 30 minutes  Barton Dubois, MD [facility] Pager [phone]  If 7PM-7AM, please contact night-coverage [email or web address] Password [email or web address] 10/18/2018, 3:02 PM

## 2018-10-18 NOTE — Progress Notes (Signed)
Spoke with sister, Hilda Blades via telephone to answer questions and concerns regarding plan of care. Discussed diagnoses and prognosis. Currently hospice GIP and will transfer to hospice facility when bed becomes available. Symptom management medications have been ordered. Hilda Blades has PMT contact information and understands to call with questions or concerns.   NO CHARGE  Ihor Dow, FNP-C Palliative Medicine Team  Phone: [phone] Fax: [phone]

## 2018-10-19 DIAGNOSIS — E876 Hypokalemia: Principal | ICD-10-CM

## 2018-10-19 DIAGNOSIS — C3490 Malignant neoplasm of unspecified part of unspecified bronchus or lung: Secondary | ICD-10-CM

## 2018-10-19 DIAGNOSIS — R627 Adult failure to thrive: Secondary | ICD-10-CM

## 2018-10-19 DIAGNOSIS — E871 Hypo-osmolality and hyponatremia: Secondary | ICD-10-CM

## 2018-10-19 DIAGNOSIS — N179 Acute kidney failure, unspecified: Secondary | ICD-10-CM

## 2018-10-19 NOTE — Progress Notes (Signed)
PROGRESS NOTE    Gregory Hickman  CXK:[id number] DOB: 03/22/1951 DOA: 10/17/2018 PCP: Joyice Faster, FNP     Brief Narrative:  67 y.o.malewith a past medical history significant for BPH, non-small cell lung cancer (affecting left alone, stage IV),HTN, severe protein calorie malnutrition and recent visit to ED due to constipation from narcotics use;who presented on 10/13/18 to the hospital secondary to generalized weakness, altered mental status and poor oral intake. Per family reports the last 3 days and not been eating or drinking anything and today was just too weak to even stand. The change in his mentation is mainly increased somnolence and lethargy. Patient has been experiencing intermittent episode of constant pain and back pain (which is not new and associated with his history of lung cancer).   Patient was noted to have brain metastases with swelling for which she has been started on some Keppra as well as Decadron. He will now be admitted to general inpatient hospice.  Assessment & Plan: 1-severe hypokalemia: Resolved for the last blood work that was done -Patient no eating or drinking in the setting of failure to thrive from lung cancer -At this moment continue comfort feeding -No further blood work.  2-severe protein calorie malnutrition -As mentioned above decision has been made to transition to full comfort care -Continue comfort feeding only.  3-malignant neoplasm of upper lobe of the left lung: Due to non-small cell lung cancer stage IV -Patient was actively receiving chemotherapy and his last chemo treatment was on 10/07/2018. -At this moment his prognosis is very poor and he will have less than 4 weeks life expectancy -After discussion with family members goals of care and advance directive decision has remained terminate patient DNR/DNI none focus on comfort feeding -No tube feedings or any further invasive  intervention.  4-hyponatremia/hypercalcemia -Calcium was 11.2 on admission and his sodium was 127 -He received fluid resuscitation with resolution of these abnormal findings and electrolytes from his last blood work -For now no further blood work will be done.  5-acute kidney injury -Creatinine on admission was 1.25 -After fluid resuscitation down to 0.77 which is his baseline -No further blood work will be done -Plan is for comfort care.  6-new episode of seizure -Associated with metastatic lesions to his brain -Patient started on Decadron and Keppra as part of comfort to prevent further seizure activity.  7-BPH/urinary retention -Continue Avodart -Insert foley as part of comfort and end of life care.  DVT prophylaxis: None (comfort care/GIP admission). Code Status: DNR/DNI Family Communication: Sister and brother-in-law at bedside. Disposition Plan: Continue full comfort care and wait for hospice bed.  Consultants:   Palliative care  Hospice  Procedures:   See below for x-ray reports  Antimicrobials:  Anti-infectives (From admission, onward)   None      Subjective: Afebrile, no chest pain, no shortness of breath, no vomiting.  Continued to have discomfort between his shoulder blades in the back and was able to have a bowel movement overnight.  Objective: There were no vitals filed for this visit.  Intake/Output Summary (Last 24 hours) at 10/19/2018 1530 Last data filed at 10/19/2018 1451 Gross per 24 hour  Intake 240 ml  Output 1351 ml  Net -1111 ml   There were no vitals filed for this visit.  Examination: General exam: Afebrile, no further seizure activity appreciated.  Patient in no acute distress and with good spirit today.  Still having some ongoing pain between his shoulder blades and on his right lower  extremity otherwise expressed that he was able to eat and drink slightly better.  Had a bowel movement overnight. Respiratory system: Clear to  auscultation. Respiratory effort normal. Cardiovascular system:RRR. No murmurs, rubs, gallops. Gastrointestinal system: Abdomen is nondistended, soft and nontender. No organomegaly or masses felt. Normal bowel sounds heard. Central nervous system: Alert and oriented. No focal neurological deficits. Extremities: No C/C/E, +pedal pulses Skin: No rashes, lesions or ulcers Psychiatry: Judgement and insight appear normal. Mood & affect appropriate.   Data Reviewed: I have personally reviewed following labs and imaging studies  CBC: Recent Labs  Lab 10/13/18 0925 10/15/18 0106  WBC 2.5* 2.9*  NEUTROABS 2.1 2.3  HGB 10.2* 9.4*  HCT 30.0* 29.4*  MCV 91.2 96.1  PLT 229 440*   Basic Metabolic Panel: Recent Labs  Lab 10/13/18 0925 10/14/18 0424 10/15/18 0106  NA 127* 133* 130*  K 2.3* 2.7* 4.7  CL 78* 91* 100  CO2 38* 33* 20*  GLUCOSE 92 98 128*  BUN 22 16 14   CREATININE 1.25* 0.78 0.77  CALCIUM 11.2* 9.0 8.4*  MG 1.9  --  1.3*  PHOS  --  2.9  --    GFR: Estimated Creatinine Clearance: 54.6 mL/min (by C-G formula based on SCr of 0.77 mg/dL).   Liver Function Tests: Recent Labs  Lab 10/15/18 0106  AST 41  ALT 17  ALKPHOS 101  BILITOT 0.4  PROT 5.9*  ALBUMIN 2.4*   Coagulation Profile: Recent Labs  Lab 10/15/18 0106  INR 0.96   Cardiac Enzymes: Recent Labs  Lab 10/15/18 0106  TROPONINI 0.05*   CBG: Recent Labs  Lab 10/13/18 0859 10/15/18 0049  GLUCAP 102* 126*   Urine analysis:    Component Value Date/Time   COLORURINE STRAW (A) 10/13/2018 0925   APPEARANCEUR CLEAR 10/13/2018 0925   LABSPEC 1.005 10/13/2018 0925   PHURINE 7.0 10/13/2018 0925   GLUCOSEU NEGATIVE 10/13/2018 0925   HGBUR SMALL (A) 10/13/2018 0925   BILIRUBINUR NEGATIVE 10/13/2018 0925   KETONESUR NEGATIVE 10/13/2018 0925   PROTEINUR NEGATIVE 10/13/2018 0925   NITRITE NEGATIVE 10/13/2018 0925   LEUKOCYTESUR NEGATIVE 10/13/2018 0925   Scheduled Meds: . dexamethasone  4 mg Oral Q8H   . dutasteride  0.5 mg Oral Daily  . feeding supplement (ENSURE ENLIVE)  237 mL Oral TID BM  . feeding supplement (PRO-STAT SUGAR FREE 64)  30 mL Oral BID  . folic acid  1 mg Oral Daily  . levETIRAcetam  500 mg Oral BID  . linaclotide  145 mcg Oral QAC breakfast  . morphine  30 mg Oral Q12H   Continuous Infusions:   LOS: 2 days    Time spent: 25 minutes  Barton Dubois, MD [facility] Pager [phone]  If 7PM-7AM, please contact night-coverage [email or web address] Password [email or web address] 10/19/2018, 3:30 PM

## 2018-10-20 MED ORDER — HALOPERIDOL 0.5 MG PO TABS: 0.5000 mg | ORAL_TABLET | ORAL | Status: AC | PRN

## 2018-10-20 MED ORDER — MORPHINE SULFATE (CONCENTRATE) 10 MG/0.5ML PO SOLN: 5.0000 mg | ORAL | 0 refills | Status: AC | PRN

## 2018-10-20 MED ORDER — NICOTINE 14 MG/24HR TD PT24: 14.0000 mg | MEDICATED_PATCH | Freq: Every day | TRANSDERMAL | Status: DC

## 2018-10-20 MED ORDER — NICOTINE 14 MG/24HR TD PT24: 14.0000 mg | MEDICATED_PATCH | Freq: Every day | TRANSDERMAL | 0 refills | Status: AC

## 2018-10-20 MED ORDER — LEVETIRACETAM 100 MG/ML PO SOLN: 500.0000 mg | Freq: Two times a day (BID) | ORAL | 12 refills | Status: AC

## 2018-10-20 MED ORDER — MORPHINE SULFATE ER 30 MG PO TBCR: 30.0000 mg | EXTENDED_RELEASE_TABLET | Freq: Two times a day (BID) | ORAL | 0 refills | Status: AC

## 2018-10-20 MED ORDER — LINACLOTIDE 145 MCG PO CAPS: 145.0000 ug | ORAL_CAPSULE | Freq: Every day | ORAL | Status: AC

## 2018-10-20 MED ORDER — GLYCOPYRROLATE 1 MG PO TABS: 1.0000 mg | ORAL_TABLET | ORAL | Status: AC | PRN

## 2018-10-20 MED ORDER — DEXAMETHASONE 4 MG PO TABS: 4.0000 mg | ORAL_TABLET | Freq: Two times a day (BID) | ORAL | Status: AC

## 2018-10-20 MED ORDER — HEPARIN SOD (PORK) LOCK FLUSH 100 UNIT/ML IV SOLN
500.0000 [IU] | INTRAVENOUS | Status: AC | PRN
  Administered 2018-10-20: 500 [IU]
  Filled 2018-10-20: qty 5

## 2018-10-20 NOTE — Progress Notes (Signed)
Deaccessed right chest port with heparin flush. Family decided to transport patient to Hospice themselves. Gave d/c paperwork to sister. Called report to Hospice.

## 2018-10-20 NOTE — Discharge Summary (Signed)
Physician Discharge Summary  RYKIN ROUTE WNI:[id number] DOB: 1950/12/08 DOA: 10/17/2018  PCP: Joyice Faster, FNP  Admit date: 10/17/2018 Discharge date: 10/20/2018  Time spent: 35 minutes  Recommendations for Outpatient Follow-up:  Full comfort care Symptomatic management No future hospitalizations.  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  Safe to transfer to hospice home for further symptomatic management and end-of-life care.  Diet recommendation: Comfort feeding  History of present illness:  67 y.o.malewith a past medical history significant for BPH, non-small cell lung cancer (affecting left alone, stage IV),HTN, severe protein calorie malnutrition and recent visit to ED due to constipation from narcotics use;who presented on 10/13/18 to the hospital secondary to generalized weakness, altered mental status and poor oral intake. Per family reports the last 3 days and not been eating or drinking anything and today was just too weak to even stand. The change in his mentation is mainly increased somnolence and lethargy. Patient has been experiencing intermittent episode of constant pain and back pain (which is not new and associated with his history of lung cancer).   Patient was noted to have brain metastases with swelling for which she has been started on some Keppra as well as Decadron. He will now be admitted to general inpatient hospice.  Hospital Course:  1-severe hypokalemia: Resolved for the last blood work that was done -Patient is eating some according to family. -At this moment continue comfort feeding -No further blood work to be done.  2-severe protein calorie malnutrition -As mentioned above decision  has been made to transition to full comfort care -Continue comfort feeding only. -no feeding tube   3-malignant neoplasm of upper lobe of the left lung: Due to non-small cell lung cancer stage IV -Patient was actively receiving chemotherapy and his last chemo treatment was on 10/07/2018. -At this moment his prognosis is very poor and he will have less than 4 weeks life expectancy -After discussion with family members goals of care and advance directive decision has remained terminate patient DNR/DNI none focus on comfort feeding -No tube feedings or any further invasive intervention.  4-hyponatremia/hypercalcemia -Calcium was 11.2 on admission and his sodium was 127 -He received fluid resuscitation with resolution of these abnormal findings and electrolytes from his last blood work -For now no further blood work will be done.  5-acute kidney injury -Creatinine on admission was 1.25 -After fluid resuscitation down to 0.77 which is his baseline -No further blood work will be done -Plan is for comfort care.  6-new episode of seizure -Associated with metastatic lesions to his brain -Patient started on Decadron and Keppra as part of comfort to prevent further seizure activity.  7-BPH/urinary retention -Continue Avodart -Insert foley as part of comfort and end of life care.  8-tobacco abuse -Nicotine patch has been ordered.  Procedures:  See below for x-ray reports   Consultations:  Palliative care   Discharge Exam:   Discharge Instructions   Discharge Instructions    Discharge instructions   Complete by:  As directed    Comfort feeding Full comfort care and symptomatic management No future hospitalization No further chemotherapy.     Allergies as of 10/20/2018   No Known Allergies     Medication List    STOP taking these medications   ALIMTA IV   amLODipine  10 MG tablet Commonly known as:  NORVASC   atorvastatin 40 MG tablet Commonly known as:   LIPITOR   BANOPHEN 12.5 MG/5ML liquid Generic drug:  diphenhydrAMINE   CARBOPLATIN IV   furosemide 20 MG tablet Commonly known as:  LASIX   KEYTRUDA IV   lidocaine-prilocaine cream Commonly known as:  EMLA   lisinopril-hydrochlorothiazide 20-12.5 MG tablet Commonly known as:  PRINZIDE,ZESTORETIC   morphine 30 MG tablet Commonly known as:  MSIR Replaced by:  morphine CONCENTRATE 10 MG/0.5ML Soln concentrated solution You also have another medication with the same name that you need to continue taking as instructed.   nabumetone 750 MG tablet Commonly known as:  RELAFEN     TAKE these medications   dexamethasone 4 MG tablet Commonly known as:  DECADRON Take 1 tablet (4 mg total) by mouth 2 (two) times daily. What changed:    how much to take  how to take this  when to take this  additional instructions   dutasteride 0.5 MG capsule Commonly known as:  AVODART Take 1 capsule by mouth daily.   folic acid 1 MG tablet Commonly known as:  FOLVITE Take 1 tablet (1 mg total) by mouth daily.   glycopyrrolate 1 MG tablet Commonly known as:  ROBINUL Take 1 tablet (1 mg total) by mouth every 4 (four) hours as needed (excessive secretions).   haloperidol 0.5 MG tablet Commonly known as:  HALDOL Take 1 tablet (0.5 mg total) by mouth every 4 (four) hours as needed for agitation (or delirium).   levETIRAcetam 100 MG/ML solution Commonly known as:  KEPPRA Take 5 mLs (500 mg total) by mouth 2 (two) times daily.   linaclotide 145 MCG Caps capsule Commonly known as:  LINZESS Take 1 capsule (145 mcg total) by mouth daily before breakfast. Start taking on:  10/21/2018   magic mouthwash Soln Take 5 mLs by mouth 4 (four) times daily. Swish and spit   morphine CONCENTRATE 10 MG/0.5ML Soln concentrated solution Place 0.25 mLs (5 mg total) under the tongue every 3 (three) hours as needed for moderate pain or shortness of breath. What changed:  You were already taking a  medication with the same name, and this prescription was added. Make sure you understand how and when to take each. Replaces:  morphine 30 MG tablet   morphine 30 MG 12 hr tablet Commonly known as:  MS CONTIN Take 1 tablet (30 mg total) by mouth every 12 (twelve) hours. What changed:    Another medication with the same name was added. Make sure you understand how and when to take each.  Another medication with the same name was removed. Continue taking this medication, and follow the directions you see here.   nicotine 14 mg/24hr patch Commonly known as:  NICODERM CQ - dosed in mg/24 hours Place 1 patch (14 mg total) onto the skin daily.   omeprazole 20 MG capsule Commonly known as:  PRILOSEC Take 1 capsule by mouth daily.   prochlorperazine 10 MG tablet Commonly known as:  COMPAZINE Take 1 tablet (10 mg total) by mouth every 6 (six) hours as needed (Nausea or vomiting).      No Known Allergies    The results of significant diagnostics from this hospitalization (including imaging, microbiology, ancillary and laboratory) are listed below for reference.    Significant Diagnostic Studies: Dg Chest 2 View  Result Date: 10/03/2018 CLINICAL DATA:  Non-small cell lung cancer. EXAM: CHEST - 2 VIEW COMPARISON:  09/13/2018.  CT  07/24/2018 FINDINGS: Right Port-A-Cath is in place with the tip in the SVC. Left upper lobe mass is again noted, likely decreased in size since prior CT. There are numerous bilateral pulmonary nodules compatible with metastases. No effusions. Heart is normal size. No acute bony abnormality. IMPRESSION: Left upper lobe mass compatible with patient's known history of lung cancer. Numerous bilateral pulmonary nodules compatible with metastases. Electronically Signed   By: Rolm Baptise M.D.   On: 10/03/2018 11:43   Dg Abdomen 1 View  Result Date: 10/11/2018 CLINICAL DATA:  Constipation and rectal pain. EXAM: ABDOMEN - 1 VIEW COMPARISON:  PET-CT dated August 12, 2018. FINDINGS: The bowel gas pattern is normal. Large amount of stool in the colon and rectum. No radio-opaque calculi or other significant radiographic abnormality are seen. No acute osseous abnormality. IMPRESSION: 1. Prominent stool throughout the colon and rectum, consistent with constipation. Correlate for fecal impaction. 2. No obstruction. Electronically Signed   By: Titus Dubin M.D.   On: 10/11/2018 10:00   Ct Head Wo Contrast  Result Date: 10/15/2018 CLINICAL DATA:  New onset seizure EXAM: CT HEAD WITHOUT CONTRAST TECHNIQUE: Contiguous axial images were obtained from the base of the skull through the vertex without intravenous contrast. COMPARISON:  MRI 08/12/2018 FINDINGS: Brain: Vasogenic edema noted within the right temporal lobe and parietal lobe, left occipital lobe in the area of previously seen metastases on MRI. 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 Chronic small vessel disease throughout the deep white matter. No hemorrhage or acute infarction. No hydrocephalus. Vascular: No hyperdense vessel or unexpected calcification. Skull: No acute calvarial abnormality. Sinuses/Orbits: Visualized paranasal sinuses and mastoids clear. Orbital soft tissues unremarkable. Other: None IMPRESSION: Edema related to widespread metastases as seen on prior MRI, unchanged. 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 No acute infarct or hemorrhage. Atrophy, chronic small vessel disease. Electronically Signed   By: Rolm Baptise M.D.   On: 10/15/2018 02:17   Dg Chest Port 1 View  Result Date: 10/15/2018 CLINICAL DATA:  New onset seizures.  History of lung cancer. EXAM: PORTABLE CHEST 1 VIEW COMPARISON:  Chest radiograph October 03, 2018 FINDINGS: Cardiac silhouette is normal size. Diffuse interstitial prominence with multiple pulmonary nodules, unchanged. Stable LEFT perihilar mass. No pleural effusion. No pneumothorax. Single-lumen RIGHT chest Port-A-Cath with distal tip projecting mid superior vena  cava. Soft tissue planes and included osseous structures are unchanged. Coarse vascular calcifications in the neck. IMPRESSION: 1. No acute cardiopulmonary process. 2. Redemonstration of LEFT perihilar mass with multiple pulmonary nodules/metastasis. Electronically Signed   By: Elon Alas M.D.   On: 10/15/2018 01:29    Microbiology: No results found for this or any previous visit (from the past 240 hour(s)).   Labs: Basic Metabolic Panel: Recent Labs  Lab 10/14/18 0424 10/15/18 0106  NA 133* 130*  K 2.7* 4.7  CL 91* 100  CO2 33* 20*  GLUCOSE 98 128*  BUN 16 14  CREATININE 0.78 0.77  CALCIUM 9.0 8.4*  MG  --  1.3*  PHOS 2.9  --    Liver Function Tests: Recent Labs  Lab 10/15/18 0106  AST 41  ALT 17  ALKPHOS 101  BILITOT 0.4  PROT 5.9*  ALBUMIN 2.4*   CBC: Recent Labs  Lab 10/15/18 0106  WBC 2.9*  NEUTROABS 2.3  HGB 9.4*  HCT 29.4*  MCV 96.1  PLT 148*   Cardiac Enzymes: Recent Labs  Lab 10/15/18 0106  TROPONINI 0.05*   CBG: Recent Labs  Lab 10/15/18 0049  GLUCAP 126*    Signed:  Barton Dubois MD.  [facility] 10/20/2018, 2:22 PM

## 2018-10-20 NOTE — Clinical Social Work Note (Signed)
The CSW is following for Gregory Hickman admission. Currently, no beds are available. The CSW will update as information becomes available.  Santiago Bumpers, MSW, Latanya Presser [phone]

## 2018-10-20 NOTE — Progress Notes (Signed)
PROGRESS NOTE    Gregory Hickman  DQQ:[id number] DOB: [birth year] DOA: 10/17/2018 PCP: Joyice Faster, FNP     Brief Narrative:  67 y.o.malewith a past medical history significant for BPH, non-small cell lung cancer (affecting left alone, stage IV),HTN, severe protein calorie malnutrition and recent visit to ED due to constipation from narcotics use;who presented on 10/13/18 to the hospital secondary to generalized weakness, altered mental status and poor oral intake. Per family reports the last 3 days and not been eating or drinking anything and today was just too weak to even stand. The change in his mentation is mainly increased somnolence and lethargy. Patient has been experiencing intermittent episode of constant pain and back pain (which is not new and associated with his history of lung cancer).   Patient was noted to have brain metastases with swelling for which she has been started on some Keppra as well as Decadron. He will now be admitted to general inpatient hospice.  Assessment & Plan: 1-severe hypokalemia: Resolved for the last blood work that was done -Patient is eating some according to family. -At this moment continue comfort feeding -No further blood work to be done.  2-severe protein calorie malnutrition -As mentioned above decision has been made to transition to full comfort care -Continue comfort feeding only. -no feeding tube   3-malignant neoplasm of upper lobe of the left lung: Due to non-small cell lung cancer stage IV -Patient was actively receiving chemotherapy and his last chemo treatment was on 10/07/2018. -At this moment his prognosis is very poor and he will have less than 4 weeks life expectancy -After discussion with family members goals of care and advance directive decision has remained terminate patient DNR/DNI none focus on comfort feeding -No tube feedings or any further invasive intervention.  4-hyponatremia/hypercalcemia -Calcium  was 11.2 on admission and his sodium was 127 -He received fluid resuscitation with resolution of these abnormal findings and electrolytes from his last blood work -For now no further blood work will be done.  5-acute kidney injury -Creatinine on admission was 1.25 -After fluid resuscitation down to 0.77 which is his baseline -No further blood work will be done -Plan is for comfort care.  6-new episode of seizure -Associated with metastatic lesions to his brain -Patient started on Decadron and Keppra as part of comfort to prevent further seizure activity.  7-BPH/urinary retention -Continue Avodart -Insert foley as part of comfort and end of life care.  8-tobacco abuse -Nicotine patch has been ordered.  DVT prophylaxis: None (comfort care/GIP admission). Code Status: DNR/DNI Family Communication: Sister and brother-in-law at bedside. Disposition Plan: Continue full comfort care and wait for hospice bed.  Consultants:   Palliative care  Hospice  Procedures:   See below for x-ray reports  Antimicrobials:  Anti-infectives (From admission, onward)   None      Subjective: No chest pain, no shortness of breath, no nausea, no vomiting, no fever.  Patient in no distress and comfortable.  Asking to go outside and smoke.  Objective: There were no vitals filed for this visit.  Intake/Output Summary (Last 24 hours) at 10/20/2018 1332 Last data filed at 10/20/2018 1200 Gross per 24 hour  Intake 720 ml  Output 1450 ml  Net -730 ml   There were no vitals filed for this visit.  Examination: General exam: Alert, awake and in no acute distress.  Reports feeling good and was asking to go outside and smoke.  No further seizures and reports pain is well controlled.  Respiratory system: Clear to auscultation. Respiratory effort normal. Cardiovascular system:RRR. No murmurs, rubs, gallops. Gastrointestinal system: Abdomen is nondistended, soft and nontender. No organomegaly or masses  felt. Normal bowel sounds heard. Central nervous system: Alert and oriented. No focal neurological deficits. Extremities: No C/C/E, +pedal pulses Skin: No rashes, lesions or ulcers Psychiatry: Judgement and insight appear normal. Mood & affect appropriate.    Data Reviewed: I have personally reviewed following labs and imaging studies  CBC: Recent Labs  Lab 10/15/18 0106  WBC 2.9*  NEUTROABS 2.3  HGB 9.4*  HCT 29.4*  MCV 96.1  PLT 209*   Basic Metabolic Panel: Recent Labs  Lab 10/14/18 0424 10/15/18 0106  NA 133* 130*  K 2.7* 4.7  CL 91* 100  CO2 33* 20*  GLUCOSE 98 128*  BUN 16 14  CREATININE 0.78 0.77  CALCIUM 9.0 8.4*  MG  --  1.3*  PHOS 2.9  --    GFR: Estimated Creatinine Clearance: 54.6 mL/min (by C-G formula based on SCr of 0.77 mg/dL).   Liver Function Tests: Recent Labs  Lab 10/15/18 0106  AST 41  ALT 17  ALKPHOS 101  BILITOT 0.4  PROT 5.9*  ALBUMIN 2.4*   Coagulation Profile: Recent Labs  Lab 10/15/18 0106  INR 0.96   Cardiac Enzymes: Recent Labs  Lab 10/15/18 0106  TROPONINI 0.05*   CBG: Recent Labs  Lab 10/15/18 0049  GLUCAP 126*   Urine analysis:    Component Value Date/Time   COLORURINE STRAW (A) 10/13/2018 0925   APPEARANCEUR CLEAR 10/13/2018 0925   LABSPEC 1.005 10/13/2018 0925   PHURINE 7.0 10/13/2018 0925   GLUCOSEU NEGATIVE 10/13/2018 0925   HGBUR SMALL (A) 10/13/2018 0925   BILIRUBINUR NEGATIVE 10/13/2018 0925   KETONESUR NEGATIVE 10/13/2018 0925   PROTEINUR NEGATIVE 10/13/2018 0925   NITRITE NEGATIVE 10/13/2018 0925   LEUKOCYTESUR NEGATIVE 10/13/2018 0925   Scheduled Meds: . dexamethasone  4 mg Oral Q8H  . dutasteride  0.5 mg Oral Daily  . feeding supplement (ENSURE ENLIVE)  237 mL Oral TID BM  . feeding supplement (PRO-STAT SUGAR FREE 64)  30 mL Oral BID  . folic acid  1 mg Oral Daily  . levETIRAcetam  500 mg Oral BID  . linaclotide  145 mcg Oral QAC breakfast  . morphine  30 mg Oral Q12H  . nicotine  14  mg Transdermal Daily   Continuous Infusions:   LOS: 3 days    Time spent: 25 minutes  Barton Dubois, MD [facility] Pager [phone]  If 7PM-7AM, please contact night-coverage [email or web address] Password [email or web address] 10/20/2018, 1:32 PM

## 2018-10-29 ENCOUNTER — Encounter (HOSPITAL_COMMUNITY): Payer: Medicare HMO | Admitting: Dietician

## 2018-10-29 ENCOUNTER — Ambulatory Visit (HOSPITAL_COMMUNITY): Payer: Medicare HMO | Admitting: Internal Medicine

## 2018-10-29 ENCOUNTER — Ambulatory Visit (HOSPITAL_COMMUNITY): Payer: Medicare HMO

## 2018-10-29 ENCOUNTER — Other Ambulatory Visit (HOSPITAL_COMMUNITY): Payer: Medicare HMO

## 2018-10-30 ENCOUNTER — Ambulatory Visit: Payer: Self-pay | Admitting: Radiation Oncology

## 2018-12-30 IMAGING — DX DG CHEST 2V
2 series · 2 of 2 positions shown · non-contrast
Comparison: 09/13/2018.  CT 07/24/2018

CLINICAL DATA: Non-small cell lung cancer.

EXAM:
CHEST - 2 VIEW

[chest pa]
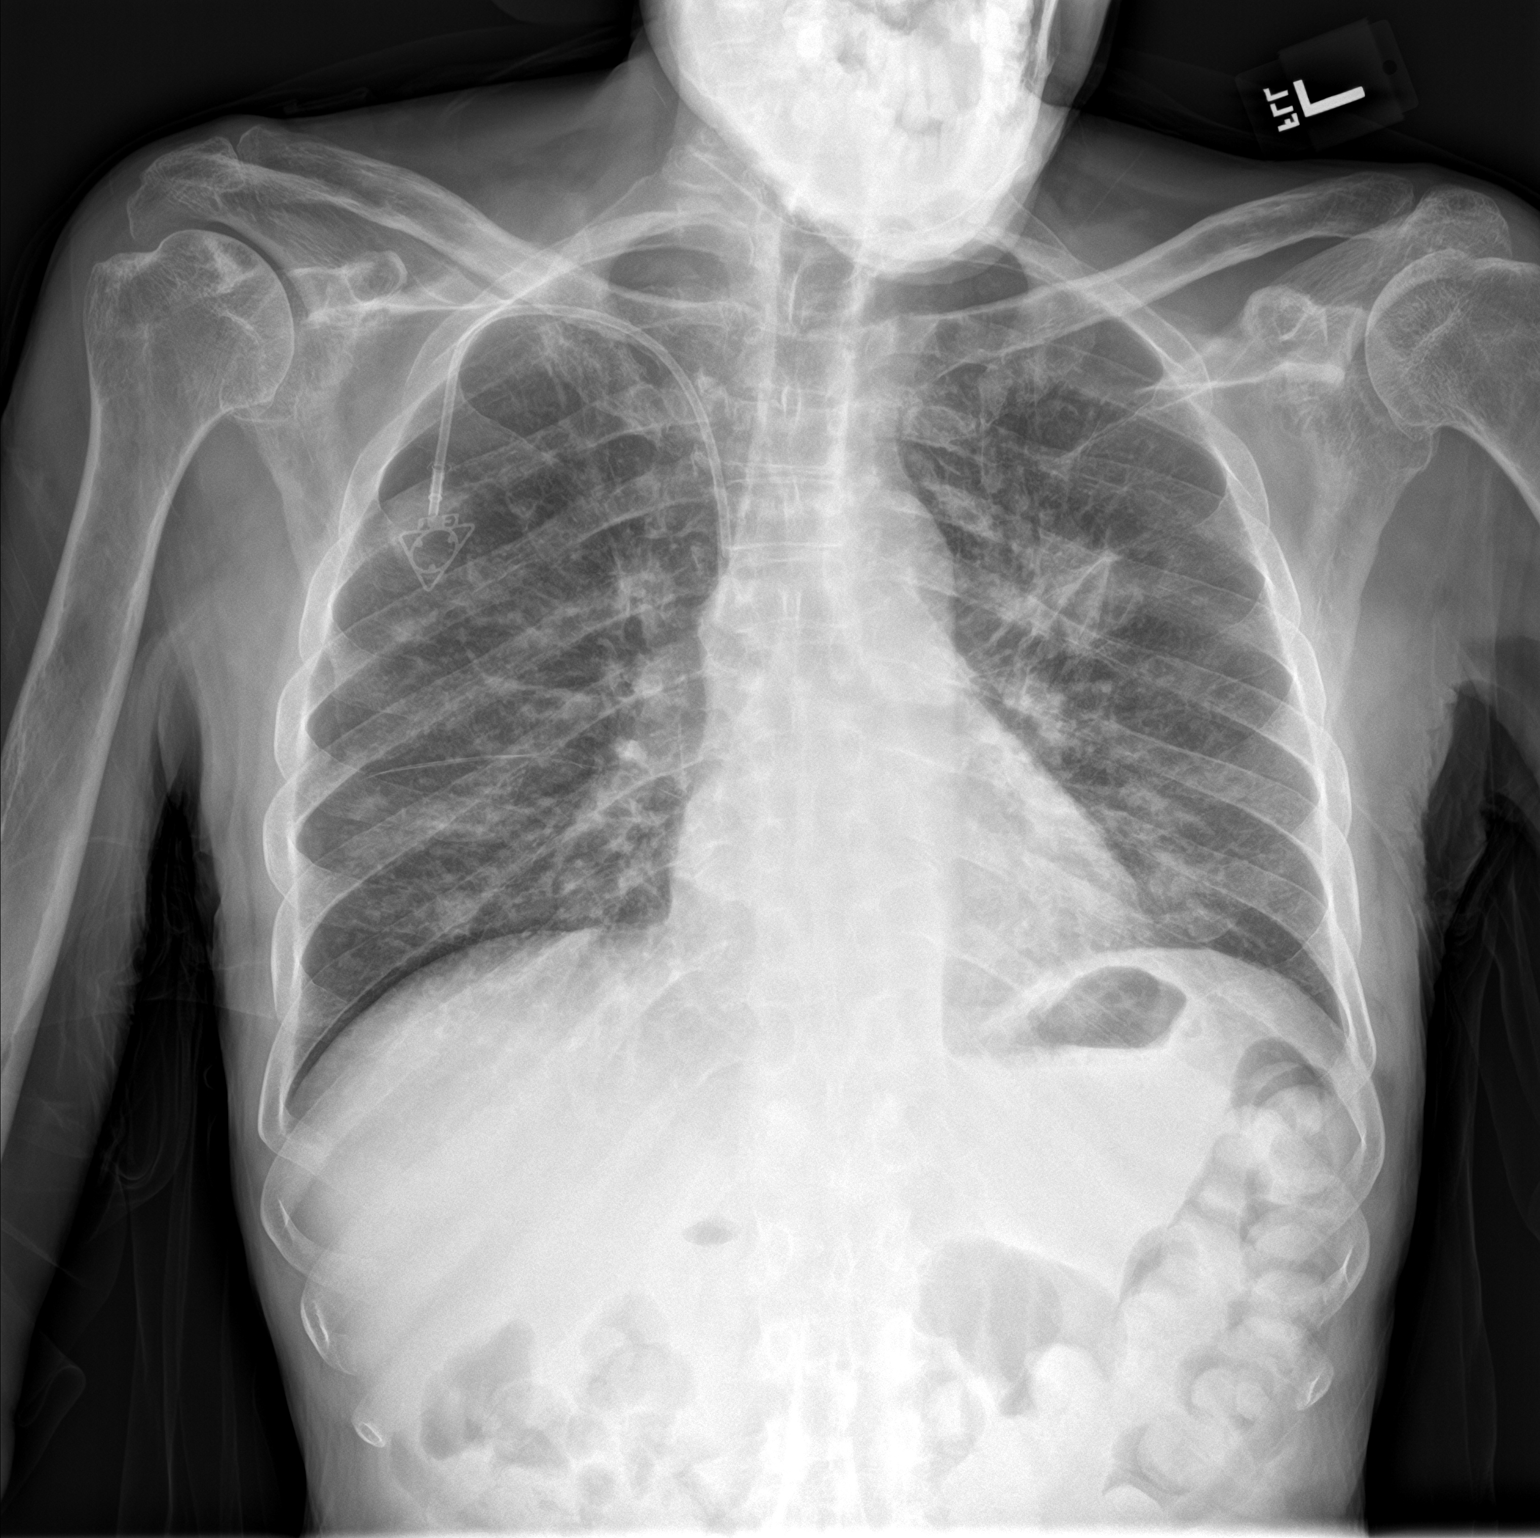

[chest lat]
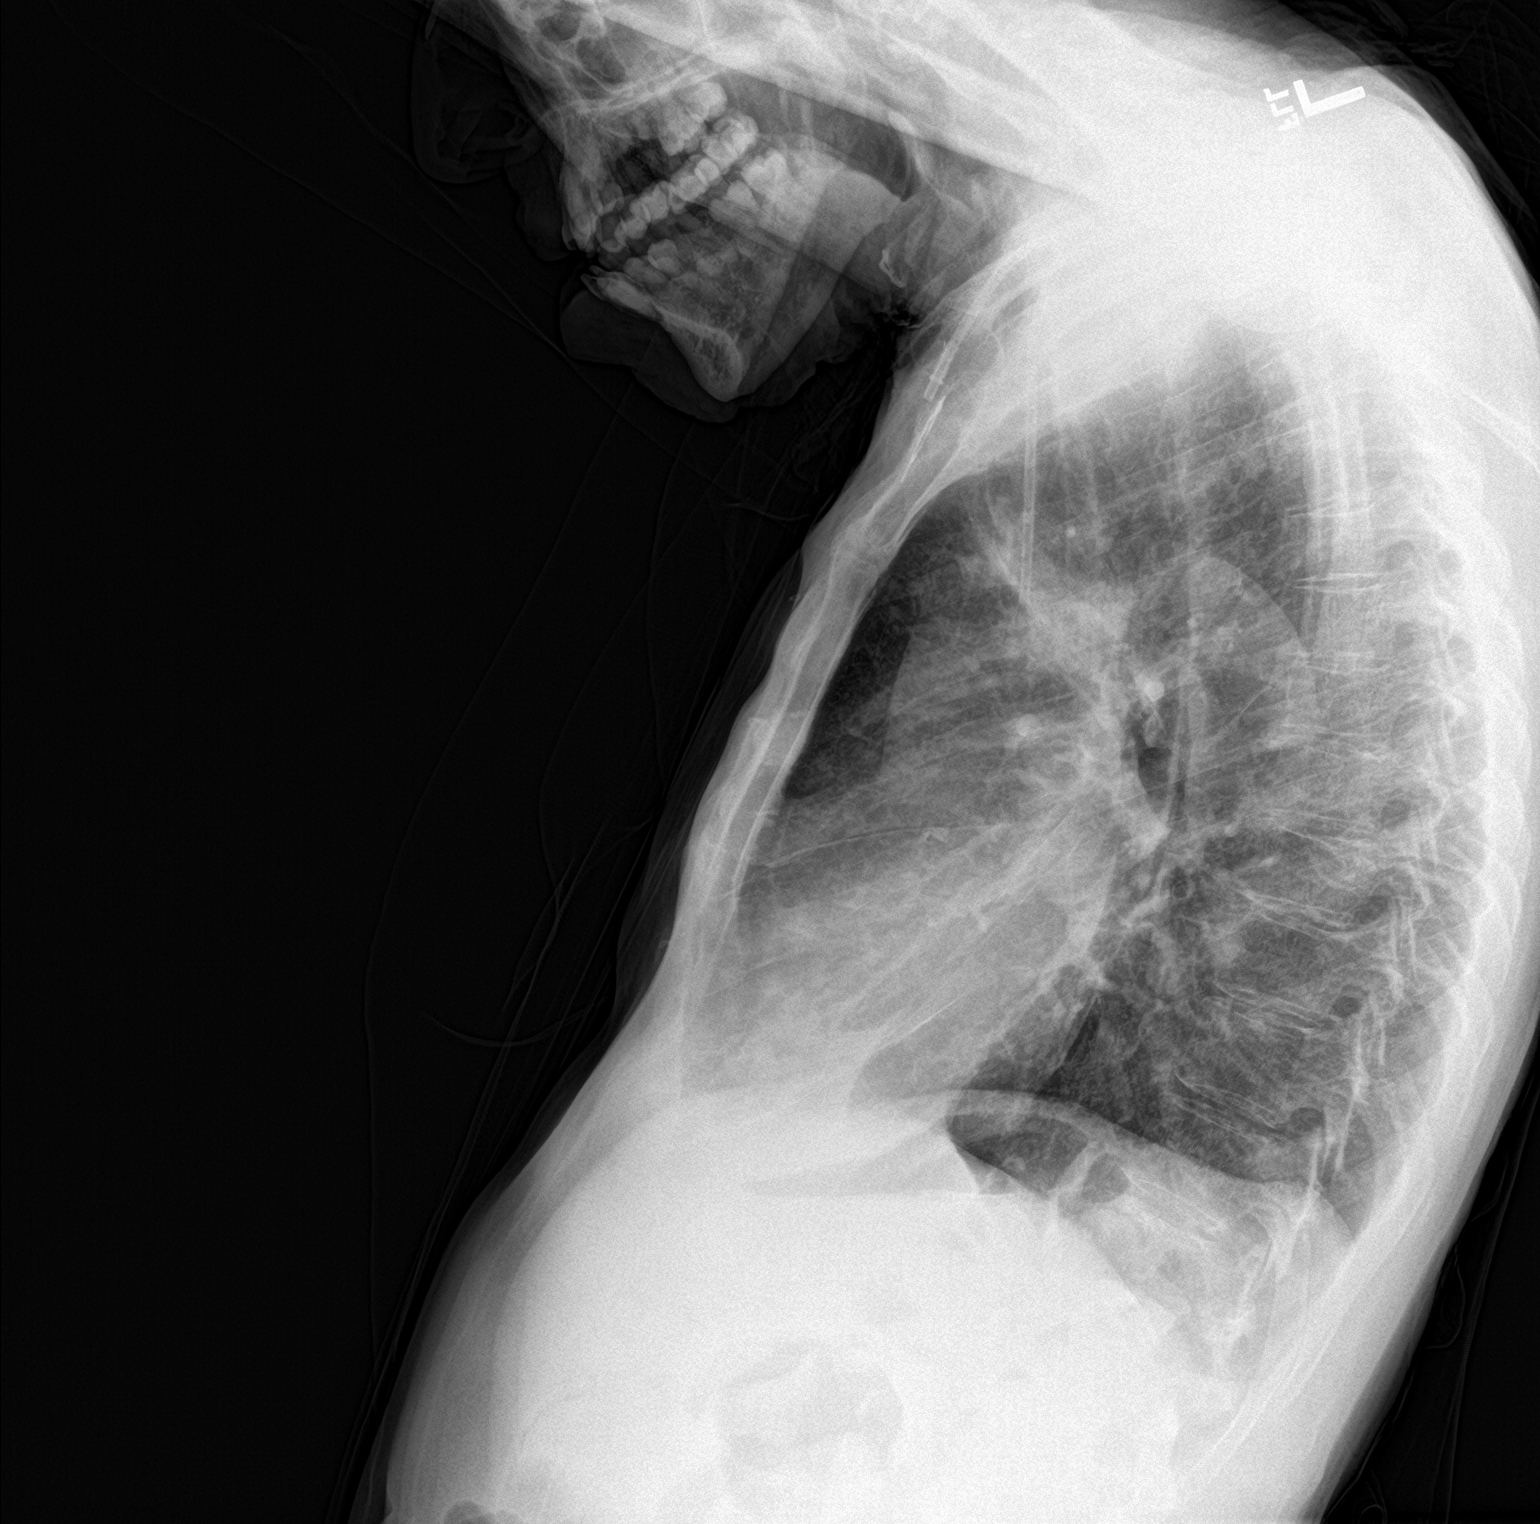

[2 of 2 positions shown; findings below may reference images not displayed]

FINDINGS: Right Port-A-Cath is in place with the tip in the SVC. Left upper
lobe mass is again noted, likely decreased in size since prior CT.
There are numerous bilateral pulmonary nodules compatible with
metastases. No effusions. Heart is normal size. No acute bony
abnormality.
IMPRESSION: Left upper lobe mass compatible with patient's known history of lung
cancer. Numerous bilateral pulmonary nodules compatible with
metastases.

## 2019-01-07 IMAGING — DX DG ABDOMEN 1V
1 series · 1 of 1 positions shown · non-contrast
Comparison: PET-CT dated August 12, 2018.

CLINICAL DATA: Constipation and rectal pain.

EXAM:
ABDOMEN - 1 VIEW

[abdomen kub]
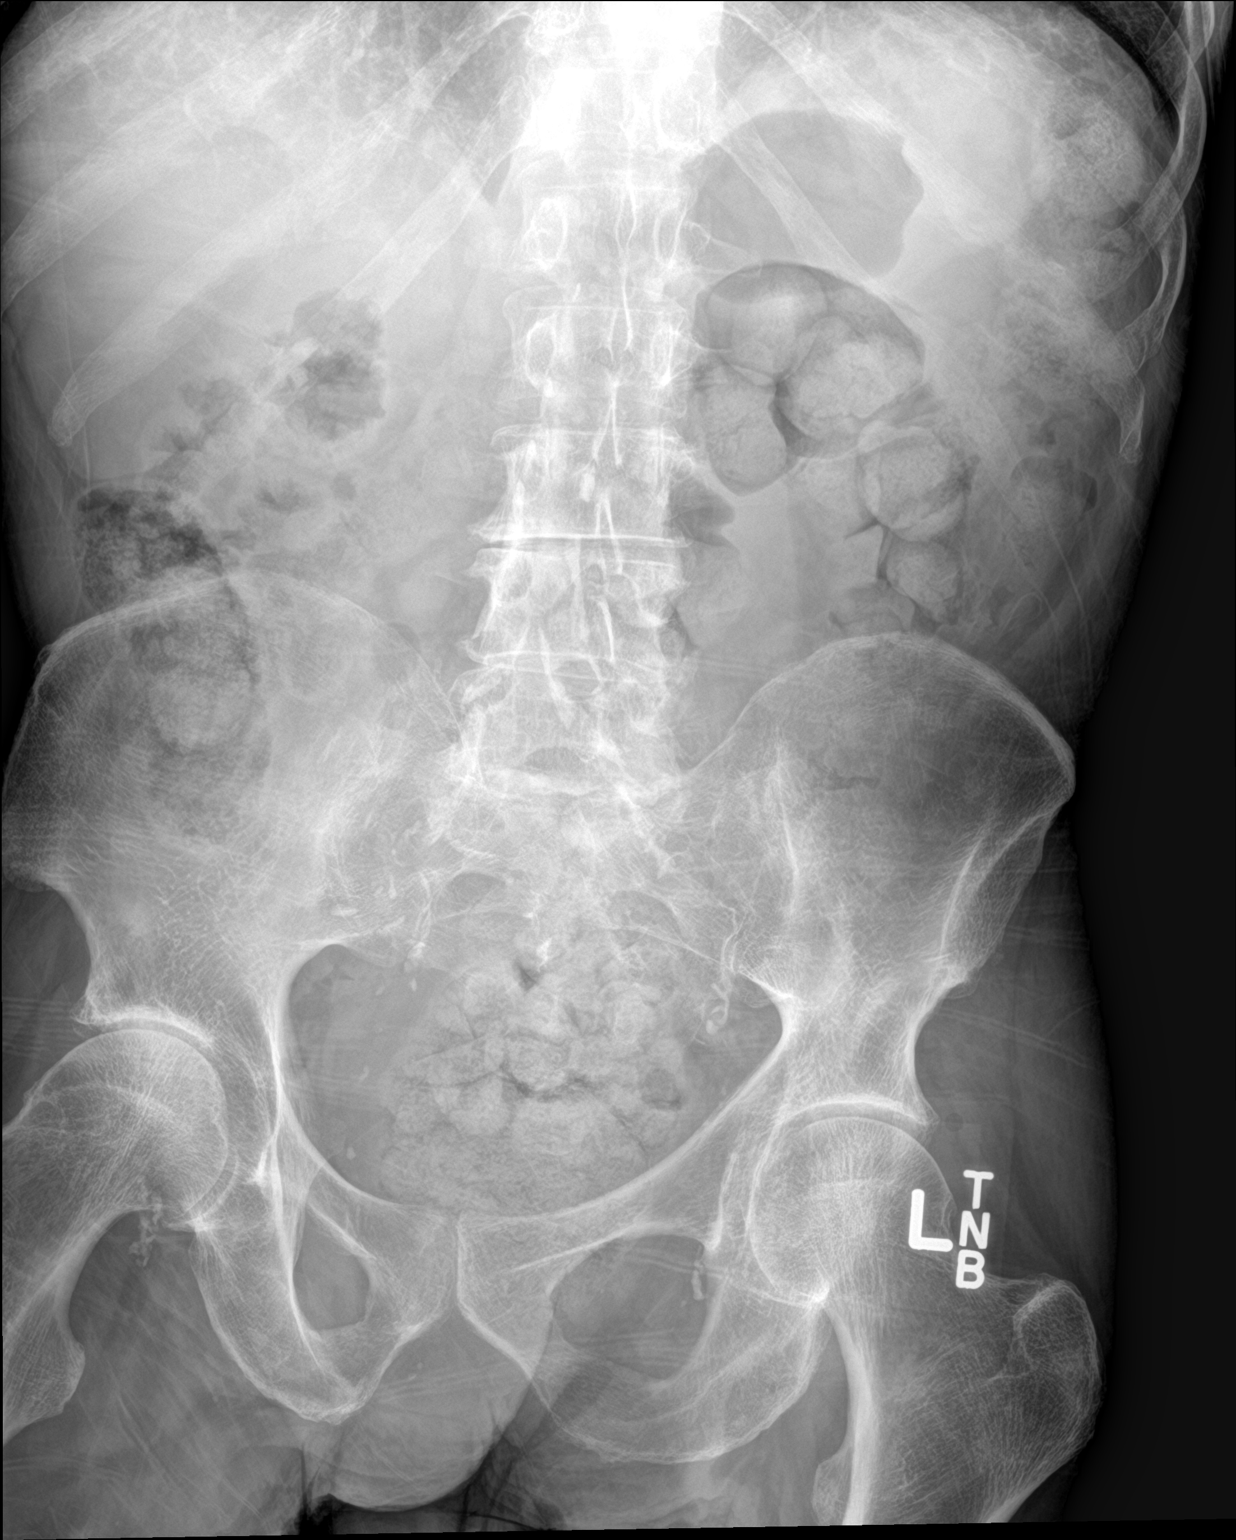

[1 of 1 positions shown; findings below may reference images not displayed]

FINDINGS: The bowel gas pattern is normal. Large amount of stool in the colon
and rectum. No radio-opaque calculi or other significant
radiographic abnormality are seen. No acute osseous abnormality.
IMPRESSION: 1. Prominent stool throughout the colon and rectum, consistent with
constipation. Correlate for fecal impaction.
2. No obstruction.

## 2019-01-12 DEATH — deceased

## 2019-12-06 ENCOUNTER — Other Ambulatory Visit: Payer: Self-pay | Admitting: Nurse Practitioner

## 2020-01-24 ENCOUNTER — Other Ambulatory Visit: Payer: Self-pay | Admitting: Nurse Practitioner
